# Patient Record
Sex: Male | Born: 1960 | Race: White | Hispanic: No | Marital: Married | Smoking: Former smoker
Health system: Southern US, Community
[De-identification: ages and names within clinical notes are randomized; demographics above are authoritative.]

## PROBLEM LIST (undated history)

## (undated) DIAGNOSIS — G4733 Obstructive sleep apnea (adult) (pediatric): Secondary | ICD-10-CM

## (undated) DIAGNOSIS — I10 Essential (primary) hypertension: Secondary | ICD-10-CM

## (undated) DIAGNOSIS — G4731 Primary central sleep apnea: Secondary | ICD-10-CM

## (undated) DIAGNOSIS — E782 Mixed hyperlipidemia: Secondary | ICD-10-CM

## (undated) DIAGNOSIS — M722 Plantar fascial fibromatosis: Secondary | ICD-10-CM

## (undated) DIAGNOSIS — K429 Umbilical hernia without obstruction or gangrene: Secondary | ICD-10-CM

## (undated) DIAGNOSIS — D126 Benign neoplasm of colon, unspecified: Secondary | ICD-10-CM

## (undated) DIAGNOSIS — M1712 Unilateral primary osteoarthritis, left knee: Secondary | ICD-10-CM

## (undated) DIAGNOSIS — G629 Polyneuropathy, unspecified: Secondary | ICD-10-CM

## (undated) DIAGNOSIS — R7303 Prediabetes: Secondary | ICD-10-CM

## (undated) DIAGNOSIS — H903 Sensorineural hearing loss, bilateral: Secondary | ICD-10-CM

## (undated) DIAGNOSIS — F1090 Alcohol use, unspecified, uncomplicated: Secondary | ICD-10-CM

## (undated) DIAGNOSIS — M1990 Unspecified osteoarthritis, unspecified site: Secondary | ICD-10-CM

## (undated) DIAGNOSIS — M069 Rheumatoid arthritis, unspecified: Secondary | ICD-10-CM

## (undated) HISTORY — PX: EYE SURGERY: SHX253

## (undated) HISTORY — DX: Primary central sleep apnea: G47.31

## (undated) HISTORY — PX: JOINT REPLACEMENT: SHX530

## (undated) HISTORY — DX: Unilateral primary osteoarthritis, left knee: M17.12

## (undated) HISTORY — PX: TOTAL KNEE ARTHROPLASTY: SHX125

## (undated) HISTORY — PX: VASECTOMY: SHX75

## (undated) HISTORY — PX: KNEE ARTHROSCOPY: SUR90

## (undated) HISTORY — PX: TONSILLECTOMY: SUR1361

## (undated) HISTORY — PX: CORNEA LACERATION REPAIR: SHX355

## (undated) HISTORY — PX: COLONOSCOPY: SHX174

## (undated) HISTORY — PX: CYSTECTOMY: SUR359

## (undated) HISTORY — PX: HAND SURGERY: SHX662

## (undated) HISTORY — PX: KNEE SURGERY: SHX244

---
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
## 1980-04-22 HISTORY — PX: FOREARM SURGERY: SHX651

## 2009-01-29 ENCOUNTER — Emergency Department: Payer: Self-pay | Admitting: Emergency Medicine

## 2009-03-23 ENCOUNTER — Ambulatory Visit: Payer: Self-pay | Admitting: Rheumatology

## 2009-11-23 ENCOUNTER — Ambulatory Visit: Payer: Self-pay | Admitting: Unknown Physician Specialty

## 2011-08-10 IMAGING — CR ORBITS FOR FOREIGN BODY - 2 VIEW
1 series · 3 of 3 positions shown · non-contrast
Comparison: none

REASON FOR EXAM: hx of metal in eye
COMMENTS:

PROCEDURE:     MDR - MDR ORBITS FOR MRI CLEARANCE  - November 23, 2009 [DATE]
RESULT:     Three views of the orbits reveal no evidence of retained
metallic foreign bodies. The bony structures are grossly normal.

[Series 1: view not recorded · 0.17mm/px · 3 of 3 slices shown]
[im 1/3]
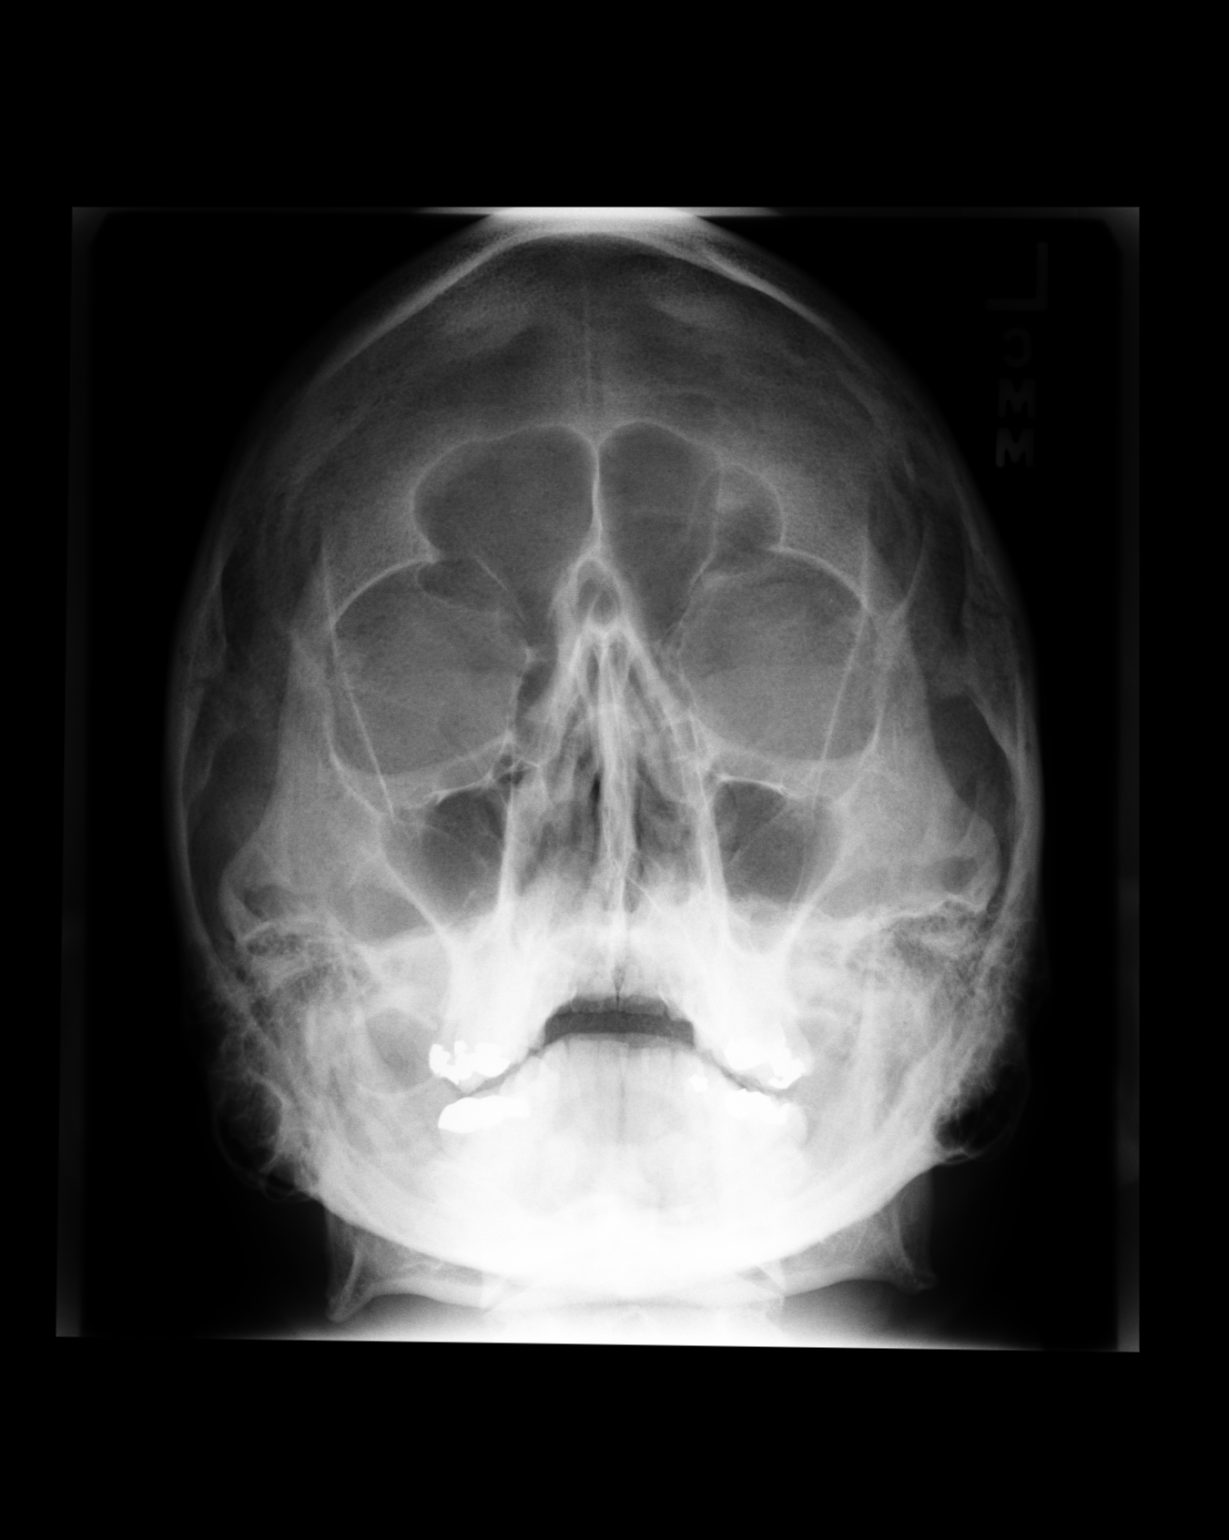
[im 2/3]
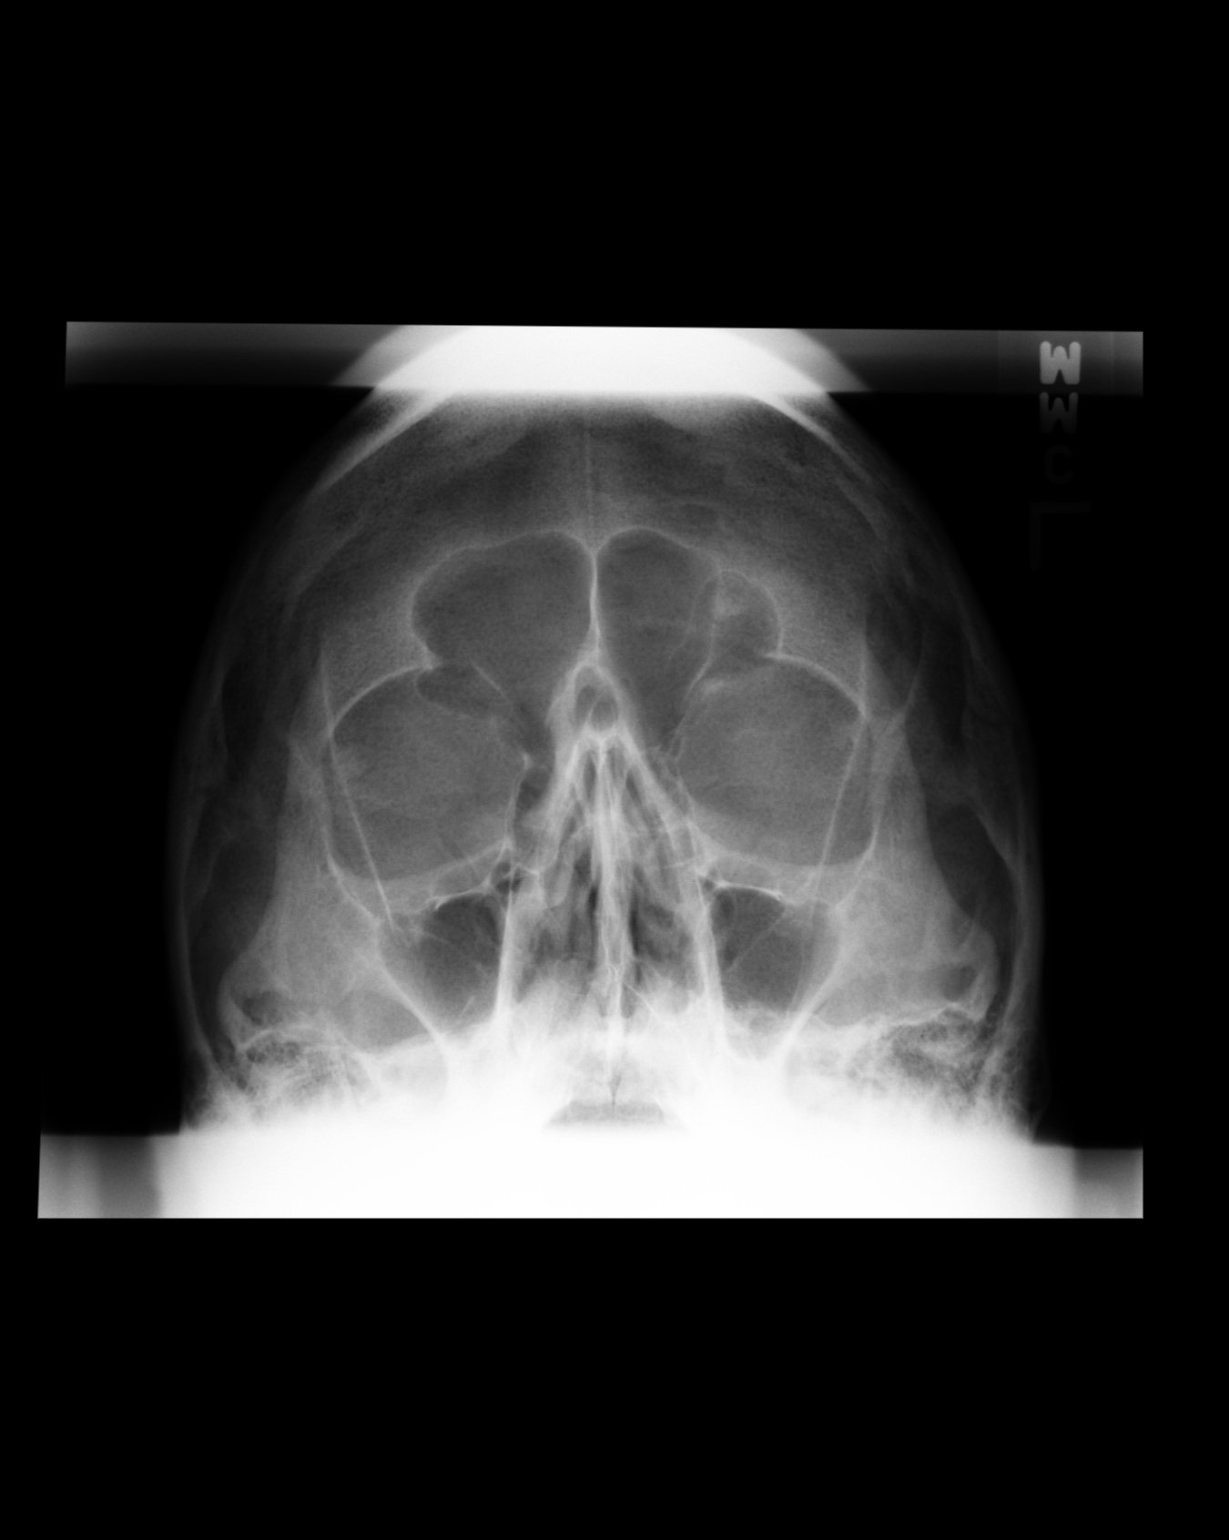
[im 3/3]
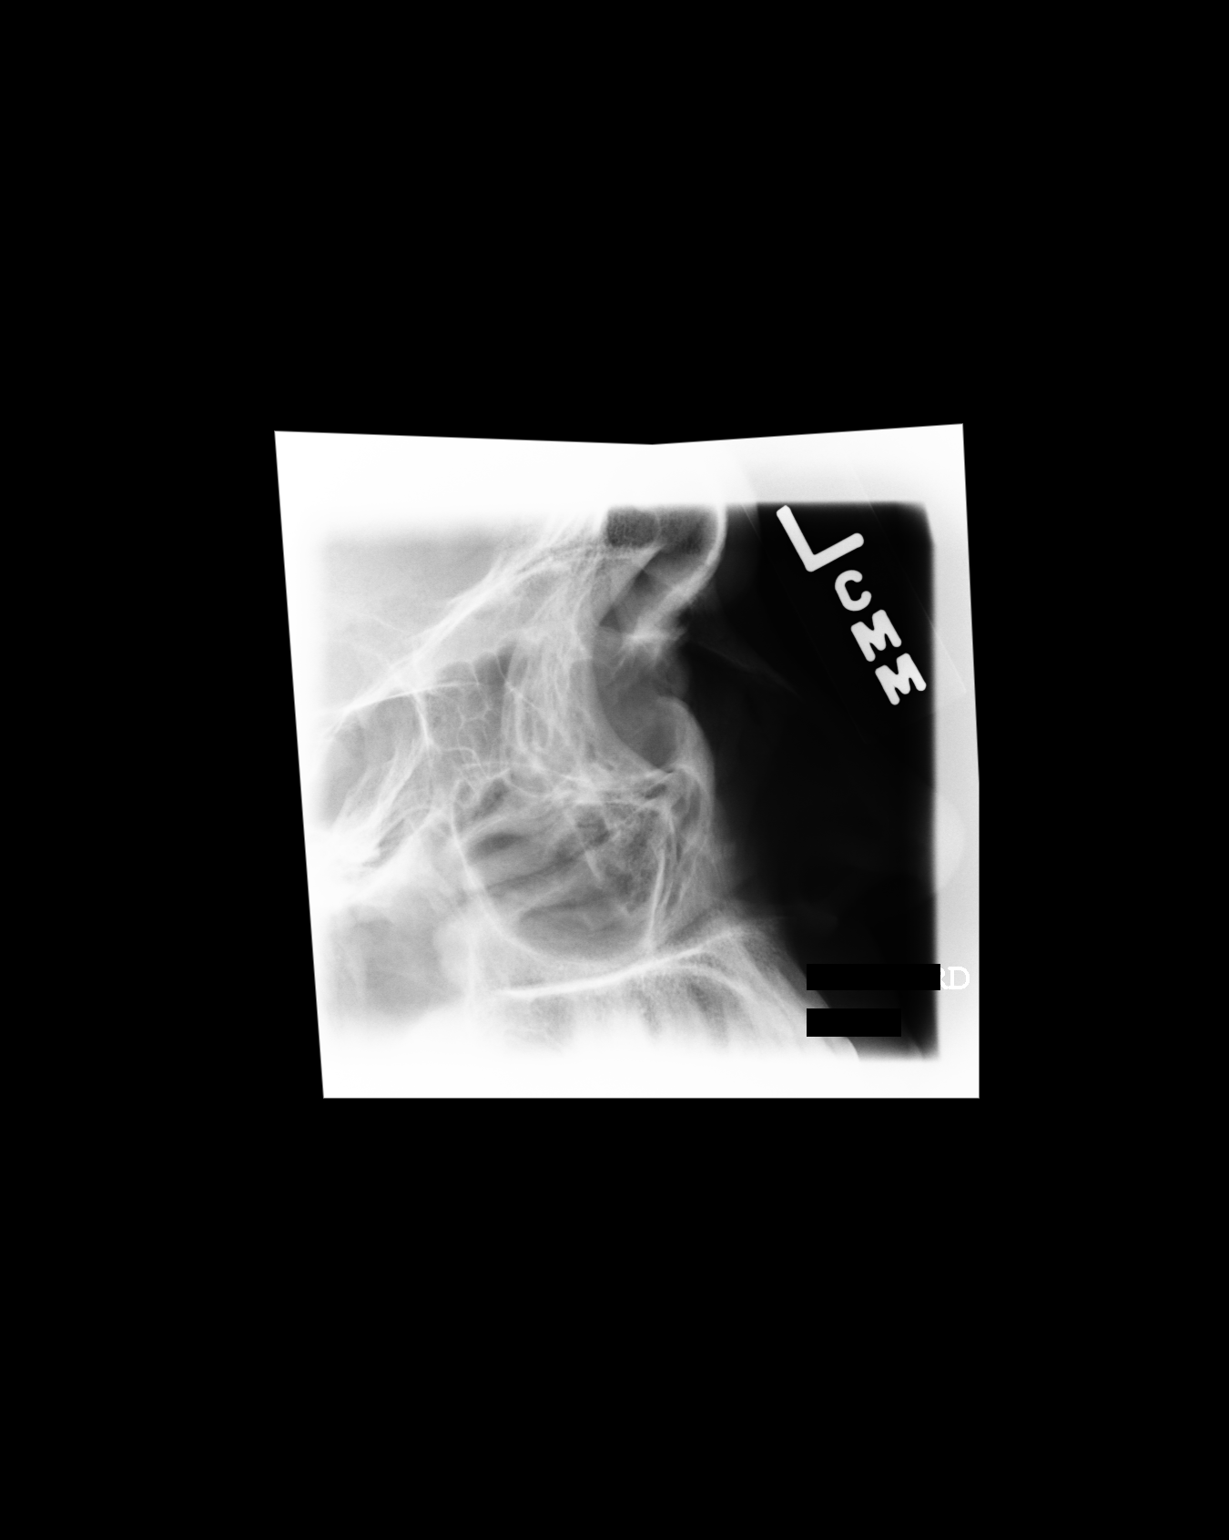

[3 of 3 positions shown; findings below may reference images not displayed]

IMPRESSION: I do not see evidence of retained metallic foreign bodies
over the orbits. I see no contraindication to MRI.

## 2013-01-27 DIAGNOSIS — G629 Polyneuropathy, unspecified: Secondary | ICD-10-CM | POA: Insufficient documentation

## 2014-09-30 ENCOUNTER — Emergency Department
Admission: EM | Admit: 2014-09-30 | Discharge: 2014-09-30 | Disposition: A | Discharge status: Home / Self Care | Payer: Worker's Compensation | Attending: Emergency Medicine | Admitting: Emergency Medicine

## 2014-09-30 ENCOUNTER — Emergency Department: Payer: Worker's Compensation

## 2014-09-30 ENCOUNTER — Encounter: Payer: Self-pay | Admitting: Emergency Medicine

## 2014-09-30 DIAGNOSIS — Y9389 Activity, other specified: Secondary | ICD-10-CM | POA: Diagnosis not present

## 2014-09-30 DIAGNOSIS — S61219A Laceration without foreign body of unspecified finger without damage to nail, initial encounter: Secondary | ICD-10-CM

## 2014-09-30 DIAGNOSIS — Y998 Other external cause status: Secondary | ICD-10-CM | POA: Diagnosis not present

## 2014-09-30 DIAGNOSIS — Z792 Long term (current) use of antibiotics: Secondary | ICD-10-CM | POA: Insufficient documentation

## 2014-09-30 DIAGNOSIS — S61314A Laceration without foreign body of right ring finger with damage to nail, initial encounter: Secondary | ICD-10-CM | POA: Diagnosis not present

## 2014-09-30 DIAGNOSIS — Y9289 Other specified places as the place of occurrence of the external cause: Secondary | ICD-10-CM | POA: Insufficient documentation

## 2014-09-30 DIAGNOSIS — W231XXA Caught, crushed, jammed, or pinched between stationary objects, initial encounter: Secondary | ICD-10-CM | POA: Insufficient documentation

## 2014-09-30 DIAGNOSIS — S61316A Laceration without foreign body of right little finger with damage to nail, initial encounter: Secondary | ICD-10-CM | POA: Diagnosis not present

## 2014-09-30 DIAGNOSIS — S6791XA Crushing injury of unspecified part(s) of right wrist, hand and fingers, initial encounter: Secondary | ICD-10-CM | POA: Diagnosis present

## 2014-09-30 DIAGNOSIS — S6720XA Crushing injury of unspecified hand, initial encounter: Secondary | ICD-10-CM

## 2014-09-30 HISTORY — DX: Rheumatoid arthritis, unspecified: M06.9

## 2014-09-30 MED ORDER — CEPHALEXIN 500 MG PO CAPS: 500.0000 mg | ORAL_CAPSULE | Freq: Four times a day (QID) | ORAL | Status: AC

## 2014-09-30 MED ORDER — LIDOCAINE HCL (PF) 1 % IJ SOLN
INTRAMUSCULAR | Status: AC
  Filled 2014-09-30: qty 10

## 2014-09-30 MED ORDER — MORPHINE SULFATE 4 MG/ML IJ SOLN
INTRAMUSCULAR | Status: AC
  Administered 2014-09-30: 4 mg
  Filled 2014-09-30: qty 1

## 2014-09-30 MED ORDER — ONDANSETRON HCL 4 MG/2ML IJ SOLN
INTRAMUSCULAR | Status: AC
  Administered 2014-09-30: 4 mg
  Filled 2014-09-30: qty 2

## 2014-09-30 MED ORDER — BACITRACIN-NEOMYCIN-POLYMYXIN 400-5-5000 EX OINT
TOPICAL_OINTMENT | CUTANEOUS | Status: AC
  Filled 2014-09-30: qty 2

## 2014-09-30 MED ORDER — OXYCODONE-ACETAMINOPHEN 5-325 MG PO TABS: 1.0000 | ORAL_TABLET | Freq: Four times a day (QID) | ORAL | Status: DC | PRN

## 2014-09-30 MED ORDER — ONDANSETRON 4 MG PO TBDP: 4.0000 mg | ORAL_TABLET | Freq: Three times a day (TID) | ORAL | Status: DC | PRN

## 2014-09-30 MED ORDER — IBUPROFEN 800 MG PO TABS: 800.0000 mg | ORAL_TABLET | Freq: Three times a day (TID) | ORAL | Status: DC | PRN

## 2014-09-30 MED ORDER — CEFAZOLIN SODIUM 1-5 GM-% IV SOLN
1.0000 g | Freq: Once | INTRAVENOUS | Status: AC
  Administered 2014-09-30: 1 g via INTRAVENOUS
  Filled 2014-09-30: qty 50

## 2014-09-30 MED ORDER — HYDROMORPHONE HCL 1 MG/ML IJ SOLN
INTRAMUSCULAR | Status: AC
  Filled 2014-09-30: qty 1

## 2014-09-30 MED ORDER — HYDROMORPHONE HCL 1 MG/ML IJ SOLN
1.0000 mg | Freq: Once | INTRAMUSCULAR | Status: AC
Start: 2014-09-30 — End: 2014-09-30
  Administered 2014-09-30: 1 mg via INTRAVENOUS

## 2014-09-30 NOTE — ED Notes (Signed)
Works at [employer], changes a sprocket, states his fingers got smashed and cut.  Right pinky and ring finger.  Bleeding controlled.  Fingers were trapped in machine for about a minute until help arrived.

## 2014-09-30 NOTE — Discharge Instructions (Signed)
Crush Injury, Fingers or Toes °A crush injury to the fingers or toes means the tissues have been damaged by being squeezed (compressed). There will be bleeding into the tissues and swelling. Often, blood will collect under the skin. When this happens, the skin on the finger often dies and may slough off (shed) 1 week to 10 days later. Usually, new skin is growing underneath. If the injury has been too severe and the tissue does not survive, the damaged tissue may begin to turn black over several days.  °Wounds which occur because of the crushing may be stitched (sutured) shut. However, crush injuries are more likely to become infected than other injuries. These wounds may not be closed as tightly as other types of cuts to prevent infection. Nails involved are often lost. These usually grow back over several weeks.  °DIAGNOSIS °X-rays may be taken to see if there is any injury to the bones. °TREATMENT °Broken bones (fractures) may be treated with splinting, depending on the fracture. Often, no treatment is required for fractures of the last bone in the fingers or toes. °HOME CARE INSTRUCTIONS  °· The crushed part should be raised (elevated) above the heart or center of the chest as much as possible for the first several days or as directed. This helps with pain and lessens swelling. Less swelling increases the chances that the crushed part will survive. °· Put ice on the injured area. °¨ Put ice in a plastic bag. °¨ Place a towel between your skin and the bag. °¨ Leave the ice on for 15-20 minutes, 03-04 times a day for the first 2 days. °· Only take over-the-counter or prescription medicines for pain, discomfort, or fever as directed by your caregiver. °· Use your injured part only as directed. °· Change your bandages (dressings) as directed. °· Keep all follow-up appointments as directed by your caregiver. Not keeping your appointment could result in a chronic or permanent injury, pain, and disability. If there is  any problem keeping the appointment, you must call to reschedule. °SEEK IMMEDIATE MEDICAL CARE IF:  °· There is redness, swelling, or increasing pain in the wound area. °· Pus is coming from the wound. °· You have a fever. °· You notice a bad smell coming from the wound or dressing. °· The edges of the wound do not stay together after the sutures have been removed. °· You are unable to move the injured finger or toe. °MAKE SURE YOU:  °· Understand these instructions. °· Will watch your condition. °· Will get help right away if you are not doing well or get worse. °Document Released: 04/08/2005 Document Revised: 07/01/2011 Document Reviewed: 08/24/2010 °[employer]® Patient Information ©2015 [employer]. This information is not intended to replace advice given to you by your health care provider. Make sure you discuss any questions you have with your health care provider. ° °

## 2014-09-30 NOTE — ED Provider Notes (Signed)
CSN: [id number]     Arrival date & time 09/30/14  1543 History   First MD Initiated Contact with Patient 09/30/14 1621     Chief Complaint  Patient presents with  . Finger Injury     (Consider location/radiation/quality/duration/timing/severity/associated sxs/prior Treatment) HPI Patient arrives today after having a crush injury to his right fourth and fifth digits states that he got them caught in a chain were stuck for approximately 1 minute and felt like a practically ripped the tips of his finger off states one of his fingers he can't move the tip of it the other one is almost completely torn rates his pain as a 10 out of 10 burning throbbing pain nothing making it particularly better or worse denies any other complaints at this time is here today for wound evaluation and closure Past Medical History  Diagnosis Date  . Rheumatoid arthritis    Past Surgical History  Procedure Laterality Date  . Hand surgery Left   . Knee surgery Right    No family history on file. History  Substance Use Topics  . Smoking status: Never Smoker   . Smokeless tobacco: Not on file  . Alcohol Use: 7.2 oz/week    12 Cans of beer per week    Review of Systems  Constitutional: Negative.   HENT: Negative.   Eyes: Negative.   Respiratory: Negative.   Cardiovascular: Negative.   Gastrointestinal: Negative.   Musculoskeletal: Negative.   Skin: Negative.   Neurological: Negative.   All other systems reviewed and are negative.      Allergies  Methotrexate derivatives  Home Medications   Prior to Admission medications   Medication Sig Start Date End Date Taking? Authorizing Provider  cephALEXin (KEFLEX) 500 MG capsule Take 1 capsule (500 mg total) by mouth 4 (four) times daily. 09/30/14 10/10/14  Camp Gopal William C Poonam Woehrle, PA-C  ibuprofen (ADVIL,MOTRIN) 800 MG tablet Take 1 tablet (800 mg total) by mouth every 8 (eight) hours as needed. 09/30/14   Jevaughn Degollado William C Burl Tauzin, PA-C  ondansetron (ZOFRAN  ODT) 4 MG disintegrating tablet Take 1 tablet (4 mg total) by mouth every 8 (eight) hours as needed for nausea or vomiting. 09/30/14   Shakil Dirk Verdene Rio, PA-C  oxyCODONE-acetaminophen (ROXICET) 5-325 MG per tablet Take 1-2 tablets by mouth every 6 (six) hours as needed for moderate pain or severe pain. 09/30/14 09/30/15  Catalina Salasar William C Ciro Tashiro, PA-C   BP 161/100 mmHg  Pulse 76  Temp(Src) 97.7 F (36.5 C) (Oral)  Resp 20  Ht 6\' 1"  (1.854 m)  Wt 220 lb (99.791 kg)  BMI 29.03 kg/m2  SpO2 97% Physical Exam Male appearing stated age well-developed well-nourished mild distress Vitals were reviewed Head ears eyes nose neck and throat examination this patient was grossly unremarkable Cardiovascular regular rate and rhythm no murmurs rubs gallops Pulmonary lungs clear to auscultation bilaterally Musculoskeletal his right hand he has a near complete amputation of his distal tip of his fifth digit exposed bone nails avulsed appears to have an extensor tendon injury to his fourth digit with a laceration both on the palmar and dorsal side of the finger limited range of motion of the hand due to pain at the fingerstick doesn't appear to be grossly functional deficit other than the 2 injured fingers total laceration length between the fingers approximately 6 cm Neuro exams nonfocal cranial nerves II through XII grossly intact Skin safe or lacerations documented in the musculoskeletal exam is otherwise free of rash or disease  Psychologically patient is very pleasant and acting appropriately ED Course  Procedures  Laceration repair Digital blocks were performed both the fourth and fifth digits on the right hand Wounds were irrigated cleaned and scrubbed with saline and Betadine And was prepped and in sterile draping 1 3-0 nylon suture was placed to the dorsal side of the fourth digit and one to the palmar side of the fourth digit laceration 1 3-0 nylon suture was placed through the nail of the fifth  digit splinting it back into place Approximately 2-1/2 ML's lidocaine were used in each finger Good hemostasis patient tolerated the procedure well Wounds were dressed and splinted Labs Review Labs Reviewed - No data to display  Imaging Review Dg Hand Complete Right  09/30/2014   CLINICAL DATA:  Near-total amputation clinically of the distal aspect of the fifth finger  EXAM: RIGHT HAND - COMPLETE 3+ VIEW  COMPARISON:  None.  FINDINGS: The patient has sustained partial amputation of the tuft of the distal phalanx of the fifth finger. The fracture fragment is distracted from the remainder of the fifth distal phalanx by approximately 5 mm. There is disruption of the overlying soft tissues. The adjacent digits are grossly normal. There does appear to be a chronic flexion deformity of the DIP joint of the fourth finger.  IMPRESSION: There is partial amputation of the distal aspect of the distal phalanx of the fifth finger.   Electronically Signed   By: David  Martinique M.D.   On: 09/30/2014 16:47     EKG Interpretation None     while the department the patient received 1 g of Ancef IV 4 mg of morphine 4 mg of Zofran and 1 mg of Dilaudid patient's tetanus was up-to-date  MDM  Decision making on this patient after initially reviewing the patient's x-rays and wound consulted orthopedics on call Dr. Rudene Christians who reviewed pictures the patient's wounds as well as x-rays and recommended packing the wounds together with just one suture and splinting them and have him follow up in the office will being discharged on medication for pain management and antibiotics discussed this with the patient they're comfortable with this plan they will follow-up with Dr. Rudene Christians Monday morning they're return here for any acute concerns or worsening symptoms Final diagnoses:  Crushing injury of hand and fingers, initial encounter  Finger laceration, initial encounter       Jennings Corado Verdene Rio, PA-C 09/30/14 1834  Hinda Kehr, MD 09/30/14 2332

## 2014-09-30 NOTE — ED Notes (Signed)
At work injured right hand, has lac to right 4th finger tip, crush injury to right 5 th finger, bleeding controlled

## 2014-11-25 DIAGNOSIS — I10 Essential (primary) hypertension: Secondary | ICD-10-CM | POA: Insufficient documentation

## 2015-01-18 ENCOUNTER — Other Ambulatory Visit: Payer: Self-pay | Admitting: Orthopedic Surgery

## 2015-01-19 ENCOUNTER — Encounter (HOSPITAL_BASED_OUTPATIENT_CLINIC_OR_DEPARTMENT_OTHER): Payer: Self-pay | Admitting: *Deleted

## 2015-01-20 ENCOUNTER — Encounter (HOSPITAL_BASED_OUTPATIENT_CLINIC_OR_DEPARTMENT_OTHER)
Admission: RE | Admit: 2015-01-20 | Discharge: 2015-01-20 | Disposition: A | Discharge status: Home / Self Care | Payer: PRIVATE HEALTH INSURANCE | Source: Ambulatory Visit | Attending: Orthopedic Surgery | Admitting: Orthopedic Surgery

## 2015-01-20 DIAGNOSIS — M069 Rheumatoid arthritis, unspecified: Secondary | ICD-10-CM | POA: Diagnosis not present

## 2015-01-20 DIAGNOSIS — Y990 Civilian activity done for income or pay: Secondary | ICD-10-CM | POA: Diagnosis not present

## 2015-01-20 DIAGNOSIS — W458XXA Other foreign body or object entering through skin, initial encounter: Secondary | ICD-10-CM | POA: Diagnosis not present

## 2015-01-20 DIAGNOSIS — M20011 Mallet finger of right finger(s): Secondary | ICD-10-CM | POA: Diagnosis not present

## 2015-01-20 DIAGNOSIS — S66324A Laceration of extensor muscle, fascia and tendon of right ring finger at wrist and hand level, initial encounter: Secondary | ICD-10-CM | POA: Diagnosis not present

## 2015-01-20 DIAGNOSIS — I10 Essential (primary) hypertension: Secondary | ICD-10-CM | POA: Diagnosis not present

## 2015-01-20 DIAGNOSIS — Z87891 Personal history of nicotine dependence: Secondary | ICD-10-CM | POA: Diagnosis not present

## 2015-01-20 LAB — BASIC METABOLIC PANEL
ANION GAP: 6 (ref 5–15)
BUN: 15 mg/dL (ref 6–20)
CHLORIDE: 101 mmol/L (ref 101–111)
CO2: 28 mmol/L (ref 22–32)
Calcium: 9.1 mg/dL (ref 8.9–10.3)
Creatinine, Ser: 0.93 mg/dL (ref 0.61–1.24)
Glucose, Bld: 104 mg/dL — ABNORMAL HIGH (ref 65–99)
POTASSIUM: 5 mmol/L (ref 3.5–5.1)
SODIUM: 135 mmol/L (ref 135–145)

## 2015-01-23 NOTE — H&P (Signed)
Francis Robinson is an 54 y.o. male.   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  He reports a work-related injury that occurred on the date above, resulting in lacerations primarily to the long and small fingers.  He was initially provided care in the emergency department, where the wounds were closed.  His small finger was severed such that there remained a volar skin bridge.  The ring finger apparently sustained a laceration at the level of the distal aspect of the middle phalanx, dividing the skin and likely the extensor tendon, creating an open mallet injury.  Subsequent to treatment in the emergency department, he was provided care by Dr. Hessie Robinson at [facility].  He reports that he went to a splinting process for the ring finger, but also had an open wound with which to deal and indicates that the splinting was not continuous, every moment of every day in the early period of healing.  More recently, on 11-16-14, due to a persisting large extensor lag, it was recommended the patient consider DIP fusion as a remedy.  The patient indicates that the persisting extensor lag at the ring finger is the most debilitating, with the finger getting caught at times on his environment due to the hooked distal end.  Past Medical History  Diagnosis Date  . Rheumatoid arthritis   . Hypertension     Past Surgical History  Procedure Laterality Date  . Hand surgery Left   . Knee surgery Right   . Tonsillectomy      History reviewed. No pertinent family history. Social History:  reports that he quit smoking about 10 years ago. He has never used smokeless tobacco. He reports that he drinks about 7.2 oz of alcohol per week. He reports that he does not use illicit drugs.  Allergies:  Allergies  Allergen Reactions  . Methotrexate Derivatives Anaphylaxis     No prescriptions prior to admission    No results found for this or any previous visit (from the past 48 hour(s)). No results found.  Review of Systems  All other systems reviewed and are negative.   Height 6\' 1"  (1.854 m), weight 99.791 kg (220 lb). Physical Exam  Constitutional:  WD, WN, NAD HEENT:  NCAT, EOMI Neuro/Psych:  Alert & oriented to person, place, and time; appropriate mood & affect Lymphatic: No generalized UE edema or lymphadenopathy Extremities / MSK:  Both UE are normal with respect to appearance, ranges of motion, joint stability, muscle strength/tone, sensation, & perfusion except as otherwise noted:  The patient's right ring and small fingers have healed dorsal wounds.  The small finger is at the tip, about the midportion of the nail.  The nail plate and nailbed appear well healed.  There is no significant tenderness with palpation of the small finger.  The ring finger laceration is slightly curvilinear at the level of the DIP joint and slightly proximal to it.  There is a 45 extensor lag at the DIP joint which is passively correctable.  Flexor tendon is intact.  The patient has an intact palmaris longus on this side.  Labs / Xrays:  No radiographic studies obtained today.  Most recent x-rays obtained elsewhere are reviewed, revealing no significant fractures of the ring finger.  In addition, the DIP joint has no significant arthritic change.  The small finger distal phalangeal  fracture has achieved partial bony union at this point   Assessment: 1.  Well healed right small finger incomplete amputation without significant ongoing sequelae 2.  Right ring finger persisting extensor lag  Recommendations:  I discussed these findings with him and his nurse case manager.  I indicated that I think there are 4 distinct options for proceeding at this point.  The first is to accept the digit as it is, optimizing his function in the context of a chronic persisting mallet  deformity.  The second would be to reinstitute a closed splinting protocol at this time, with protracted full-time extension splinting of the DIP joint to see whether it would be successful in alleviating the extensor lag.  The third and fourth options are both surgical.  The third option would be open repair of the extensor apparatus, possibly with grafting, with likely temporary DIP joint pinning.  The fourth option is arthrodesis of the joint.  I indicated that the likelihood of success of option #2 is unclear.  If one were to employ option #3, there may still be some degree of recurrent extensor lag anyway.  In addition, even if one were to opt for option #3, intraoperatively there may be a need to switch from option #3 to option #4 based upon the status of the extensor mechanism once opened and explored.    In addition, we discussed in some degree of length in detail rationale for different positions for fusion of the DIP joint, methods used to obtain fusion, et Francis Robinson.  Although fusion would be highly predictable in alleviating the extensor lag, it does so at the cost of sacrificing an otherwise unblemished joint for the sake of his extensor tendon deficiency when it remains unclear whether such deficiency itself could be repaired or reconstructed.  Francis Calia A. 01/23/2015, 2:46 PM

## 2015-01-24 ENCOUNTER — Ambulatory Visit (HOSPITAL_BASED_OUTPATIENT_CLINIC_OR_DEPARTMENT_OTHER)
Admission: RE | Admit: 2015-01-24 | Discharge: 2015-01-24 | Disposition: A | Discharge status: Home / Self Care | Payer: Worker's Compensation | Source: Ambulatory Visit | Attending: Orthopedic Surgery | Admitting: Orthopedic Surgery

## 2015-01-24 ENCOUNTER — Encounter (HOSPITAL_BASED_OUTPATIENT_CLINIC_OR_DEPARTMENT_OTHER)
Admission: RE | Disposition: A | Discharge status: Home / Self Care | Payer: Self-pay | Source: Ambulatory Visit | Attending: Orthopedic Surgery

## 2015-01-24 ENCOUNTER — Encounter (HOSPITAL_BASED_OUTPATIENT_CLINIC_OR_DEPARTMENT_OTHER): Payer: Self-pay

## 2015-01-24 ENCOUNTER — Ambulatory Visit (HOSPITAL_BASED_OUTPATIENT_CLINIC_OR_DEPARTMENT_OTHER): Payer: Worker's Compensation | Admitting: Anesthesiology

## 2015-01-24 ENCOUNTER — Ambulatory Visit (HOSPITAL_COMMUNITY): Payer: Worker's Compensation

## 2015-01-24 DIAGNOSIS — S66324A Laceration of extensor muscle, fascia and tendon of right ring finger at wrist and hand level, initial encounter: Secondary | ICD-10-CM | POA: Insufficient documentation

## 2015-01-24 DIAGNOSIS — M20011 Mallet finger of right finger(s): Secondary | ICD-10-CM | POA: Diagnosis not present

## 2015-01-24 DIAGNOSIS — W458XXA Other foreign body or object entering through skin, initial encounter: Secondary | ICD-10-CM | POA: Insufficient documentation

## 2015-01-24 DIAGNOSIS — M9689 Other intraoperative and postprocedural complications and disorders of the musculoskeletal system: Secondary | ICD-10-CM

## 2015-01-24 DIAGNOSIS — I10 Essential (primary) hypertension: Secondary | ICD-10-CM | POA: Diagnosis not present

## 2015-01-24 DIAGNOSIS — Y990 Civilian activity done for income or pay: Secondary | ICD-10-CM | POA: Insufficient documentation

## 2015-01-24 DIAGNOSIS — M069 Rheumatoid arthritis, unspecified: Secondary | ICD-10-CM | POA: Diagnosis not present

## 2015-01-24 DIAGNOSIS — Z87891 Personal history of nicotine dependence: Secondary | ICD-10-CM | POA: Insufficient documentation

## 2015-01-24 HISTORY — DX: Essential (primary) hypertension: I10

## 2015-01-24 HISTORY — PX: REPAIR EXTENSOR TENDON: SHX5382

## 2015-01-24 SURGERY — REPAIR, TENDON, EXTENSOR
Anesthesia: General | Site: Finger | Laterality: Right

## 2015-01-24 MED ORDER — FENTANYL CITRATE (PF) 100 MCG/2ML IJ SOLN
INTRAMUSCULAR | Status: AC
  Filled 2015-01-24: qty 4

## 2015-01-24 MED ORDER — MIDAZOLAM HCL 2 MG/2ML IJ SOLN
1.0000 mg | INTRAMUSCULAR | Status: DC | PRN
  Administered 2015-01-24: 2 mg via INTRAVENOUS

## 2015-01-24 MED ORDER — ONDANSETRON HCL 4 MG/2ML IJ SOLN
INTRAMUSCULAR | Status: DC | PRN
  Administered 2015-01-24: 4 mg via INTRAVENOUS

## 2015-01-24 MED ORDER — PROPOFOL 10 MG/ML IV BOLUS
INTRAVENOUS | Status: AC
  Filled 2015-01-24: qty 20

## 2015-01-24 MED ORDER — DEXAMETHASONE SODIUM PHOSPHATE 10 MG/ML IJ SOLN
INTRAMUSCULAR | Status: DC | PRN
  Administered 2015-01-24: 10 mg via INTRAVENOUS

## 2015-01-24 MED ORDER — MIDAZOLAM HCL 2 MG/2ML IJ SOLN
INTRAMUSCULAR | Status: AC
  Filled 2015-01-24: qty 4

## 2015-01-24 MED ORDER — MEPERIDINE HCL 25 MG/ML IJ SOLN: 6.2500 mg | INTRAMUSCULAR | Status: DC | PRN

## 2015-01-24 MED ORDER — LACTATED RINGERS IV SOLN
INTRAVENOUS | Status: DC
  Administered 2015-01-24 (×2): via INTRAVENOUS

## 2015-01-24 MED ORDER — ONDANSETRON HCL 4 MG/2ML IJ SOLN
INTRAMUSCULAR | Status: AC
  Filled 2015-01-24: qty 2

## 2015-01-24 MED ORDER — SCOPOLAMINE 1 MG/3DAYS TD PT72: 1.0000 | MEDICATED_PATCH | Freq: Once | TRANSDERMAL | Status: DC | PRN

## 2015-01-24 MED ORDER — LIDOCAINE HCL (CARDIAC) 20 MG/ML IV SOLN
INTRAVENOUS | Status: DC | PRN
  Administered 2015-01-24: 50 mg via INTRAVENOUS

## 2015-01-24 MED ORDER — DEXAMETHASONE SODIUM PHOSPHATE 10 MG/ML IJ SOLN
INTRAMUSCULAR | Status: AC
  Filled 2015-01-24: qty 1

## 2015-01-24 MED ORDER — KETOROLAC TROMETHAMINE 30 MG/ML IJ SOLN
INTRAMUSCULAR | Status: AC
  Filled 2015-01-24: qty 1

## 2015-01-24 MED ORDER — BUPIVACAINE-EPINEPHRINE (PF) 0.5% -1:200000 IJ SOLN
INTRAMUSCULAR | Status: AC
  Filled 2015-01-24: qty 90

## 2015-01-24 MED ORDER — OXYCODONE HCL 5 MG PO TABS: 5.0000 mg | ORAL_TABLET | Freq: Once | ORAL | Status: DC | PRN

## 2015-01-24 MED ORDER — BUPIVACAINE-EPINEPHRINE 0.5% -1:200000 IJ SOLN
INTRAMUSCULAR | Status: DC | PRN
  Administered 2015-01-24: 10 mL

## 2015-01-24 MED ORDER — LIDOCAINE HCL (CARDIAC) 20 MG/ML IV SOLN
INTRAVENOUS | Status: AC
  Filled 2015-01-24: qty 15

## 2015-01-24 MED ORDER — OXYCODONE-ACETAMINOPHEN 5-325 MG PO TABS: 1.0000 | ORAL_TABLET | Freq: Four times a day (QID) | ORAL | Status: DC | PRN

## 2015-01-24 MED ORDER — PROPOFOL 10 MG/ML IV BOLUS
INTRAVENOUS | Status: DC | PRN
  Administered 2015-01-24: 250 mg via INTRAVENOUS

## 2015-01-24 MED ORDER — FENTANYL CITRATE (PF) 100 MCG/2ML IJ SOLN
50.0000 ug | INTRAMUSCULAR | Status: DC | PRN
  Administered 2015-01-24: 100 ug via INTRAVENOUS

## 2015-01-24 MED ORDER — KETOROLAC TROMETHAMINE 30 MG/ML IJ SOLN
INTRAMUSCULAR | Status: DC | PRN
  Administered 2015-01-24: 30 mg via INTRAVENOUS

## 2015-01-24 MED ORDER — CEFAZOLIN SODIUM-DEXTROSE 2-3 GM-% IV SOLR
2.0000 g | INTRAVENOUS | Status: AC
  Administered 2015-01-24: 2 g via INTRAVENOUS

## 2015-01-24 MED ORDER — GLYCOPYRROLATE 0.2 MG/ML IJ SOLN: 0.2000 mg | Freq: Once | INTRAMUSCULAR | Status: DC | PRN

## 2015-01-24 MED ORDER — OXYCODONE HCL 5 MG/5ML PO SOLN: 5.0000 mg | Freq: Once | ORAL | Status: DC | PRN

## 2015-01-24 MED ORDER — LACTATED RINGERS IV SOLN: INTRAVENOUS | Status: DC

## 2015-01-24 MED ORDER — CEFAZOLIN SODIUM-DEXTROSE 2-3 GM-% IV SOLR
INTRAVENOUS | Status: AC
  Filled 2015-01-24: qty 50

## 2015-01-24 MED ORDER — HYDROMORPHONE HCL 1 MG/ML IJ SOLN: 0.2500 mg | INTRAMUSCULAR | Status: DC | PRN

## 2015-01-24 SURGICAL SUPPLY — 69 items
BLADE MINI RND TIP GREEN BEAV (BLADE) IMPLANT
BLADE SURG 15 STRL LF DISP TIS (BLADE) ×1 IMPLANT
BLADE SURG 15 STRL SS (BLADE) ×2
BNDG COHESIVE 1X5 TAN STRL LF (GAUZE/BANDAGES/DRESSINGS) ×3 IMPLANT
BNDG COHESIVE 4X5 TAN STRL (GAUZE/BANDAGES/DRESSINGS) ×3 IMPLANT
BNDG CONFORM 2 STRL LF (GAUZE/BANDAGES/DRESSINGS) ×3 IMPLANT
BNDG ESMARK 4X9 LF (GAUZE/BANDAGES/DRESSINGS) ×3 IMPLANT
BNDG GAUZE ELAST 4 BULKY (GAUZE/BANDAGES/DRESSINGS) IMPLANT
CHLORAPREP W/TINT 26ML (MISCELLANEOUS) ×3 IMPLANT
CORDS BIPOLAR (ELECTRODE) ×3 IMPLANT
COVER BACK TABLE 60X90IN (DRAPES) ×3 IMPLANT
COVER MAYO STAND STRL (DRAPES) ×3 IMPLANT
CUFF TOURNIQUET SINGLE 18IN (TOURNIQUET CUFF) ×3 IMPLANT
DECANTER SPIKE VIAL GLASS SM (MISCELLANEOUS) IMPLANT
DEPRESSOR TONGUE BLADE STERILE (MISCELLANEOUS) ×3 IMPLANT
DRAPE EXTREMITY T 121X128X90 (DRAPE) ×3 IMPLANT
DRAPE SURG 17X23 STRL (DRAPES) ×3 IMPLANT
DRSG EMULSION OIL 3X3 NADH (GAUZE/BANDAGES/DRESSINGS) ×3 IMPLANT
GLOVE BIO SURGEON STRL SZ 6.5 (GLOVE) ×2 IMPLANT
GLOVE BIO SURGEON STRL SZ7 (GLOVE) ×3 IMPLANT
GLOVE BIO SURGEON STRL SZ7.5 (GLOVE) ×3 IMPLANT
GLOVE BIO SURGEONS STRL SZ 6.5 (GLOVE) ×1
GLOVE BIOGEL PI IND STRL 7.0 (GLOVE) ×2 IMPLANT
GLOVE BIOGEL PI IND STRL 7.5 (GLOVE) ×1 IMPLANT
GLOVE BIOGEL PI IND STRL 8 (GLOVE) ×1 IMPLANT
GLOVE BIOGEL PI INDICATOR 7.0 (GLOVE) ×4
GLOVE BIOGEL PI INDICATOR 7.5 (GLOVE) ×2
GLOVE BIOGEL PI INDICATOR 8 (GLOVE) ×2
GLOVE ECLIPSE 6.5 STRL STRAW (GLOVE) ×3 IMPLANT
GLOVE EXAM NITRILE EXT CUFF MD (GLOVE) ×3 IMPLANT
GOWN STRL REUS W/ TWL LRG LVL3 (GOWN DISPOSABLE) ×2 IMPLANT
GOWN STRL REUS W/TWL LRG LVL3 (GOWN DISPOSABLE) ×4
GOWN STRL REUS W/TWL XL LVL3 (GOWN DISPOSABLE) ×3 IMPLANT
K-WIRE .062X4 (WIRE) ×3 IMPLANT
LOOP VESSEL MAXI BLUE (MISCELLANEOUS) IMPLANT
LOOP VESSEL MINI RED (MISCELLANEOUS) IMPLANT
NEEDLE HYPO 25X1 1.5 SAFETY (NEEDLE) ×3 IMPLANT
NS IRRIG 1000ML POUR BTL (IV SOLUTION) ×3 IMPLANT
PACK BASIN DAY SURGERY FS (CUSTOM PROCEDURE TRAY) ×3 IMPLANT
PADDING CAST ABS 3INX4YD NS (CAST SUPPLIES)
PADDING CAST ABS 4INX4YD NS (CAST SUPPLIES) ×2
PADDING CAST ABS COTTON 3X4 (CAST SUPPLIES) IMPLANT
PADDING CAST ABS COTTON 4X4 ST (CAST SUPPLIES) ×1 IMPLANT
SLEEVE SCD COMPRESS KNEE MED (MISCELLANEOUS) IMPLANT
SLING ARM LRG ADULT FOAM STRAP (SOFTGOODS) IMPLANT
SPLINT PLASTER CAST XFAST 3X15 (CAST SUPPLIES) IMPLANT
SPLINT PLASTER XTRA FASTSET 3X (CAST SUPPLIES)
SPONGE GAUZE 4X4 12PLY STER LF (GAUZE/BANDAGES/DRESSINGS) ×3 IMPLANT
STOCKINETTE 6  STRL (DRAPES) ×2
STOCKINETTE 6 STRL (DRAPES) ×1 IMPLANT
SUT ETHIBOND 3-0 V-5 (SUTURE) IMPLANT
SUT FIBERWIRE 2-0 18 17.9 3/8 (SUTURE)
SUT MERSILENE 4 0 P 3 (SUTURE) IMPLANT
SUT PROLENE 6 0 P 1 18 (SUTURE) ×3 IMPLANT
SUT SILK 4 0 PS 2 (SUTURE) IMPLANT
SUT STEEL 4 (SUTURE) IMPLANT
SUT SUPRAMID 3-0 (SUTURE) IMPLANT
SUT SUPRAMID 4-0 (SUTURE) IMPLANT
SUT VICRYL 3-0 RB1 (SUTURE) ×3 IMPLANT
SUT VICRYL RAPIDE 4-0 (SUTURE) ×3 IMPLANT
SUT VICRYL RAPIDE 4/0 PS 2 (SUTURE) ×3 IMPLANT
SUTURE FIBERWR 2-0 18 17.9 3/8 (SUTURE) IMPLANT
SYR BULB 3OZ (MISCELLANEOUS) ×3 IMPLANT
SYRINGE 10CC LL (SYRINGE) ×3 IMPLANT
TOWEL OR 17X24 6PK STRL BLUE (TOWEL DISPOSABLE) ×3 IMPLANT
TUBE CONNECTING 20'X1/4 (TUBING)
TUBE CONNECTING 20X1/4 (TUBING) IMPLANT
TUBE FEEDING 5FR 15 INCH (TUBING) ×3 IMPLANT
UNDERPAD 30X30 (UNDERPADS AND DIAPERS) ×3 IMPLANT

## 2015-01-24 NOTE — Interval H&P Note (Signed)
History and Physical Interval Note:  01/24/2015 1:18 PM  Francis Robinson  has presented today for surgery, with the diagnosis of RIGHT RING FINGER TRAMATIC MALLET DEFORMITY  The various methods of treatment have been discussed with the patient and family. After consideration of risks, benefits and other options for treatment, the patient has consented to  Procedure(s): RIGHT RING [place] (Right) as a surgical intervention .  The patient's history has been reviewed, patient examined, no change in status, stable for surgery.  I have reviewed the patient's chart and labs.  Questions were answered to the patient's satisfaction.     [name]

## 2015-01-24 NOTE — Transfer of Care (Signed)
Immediate Anesthesia Transfer of Care Note  Patient: Francis Robinson  Procedure(s) Performed: Procedure(s): RIGHT RING FINGERMALLET REPAIR  (Right)  Patient Location: PACU  Anesthesia Type:General  Level of Consciousness: sedated  Airway & Oxygen Therapy: Patient Spontanous Breathing and Patient connected to nasal cannula oxygen  Post-op Assessment: Report given to RN and Post -op Vital signs reviewed and stable  Post vital signs: Reviewed and stable  Last Vitals:  Filed Vitals:   01/24/15 1301  BP: 178/91  Pulse: 77  Temp: 36.7 C  Resp: 18    Complications: No apparent anesthesia complications

## 2015-01-24 NOTE — Anesthesia Procedure Notes (Signed)
Procedure Name: LMA Insertion Date/Time: 01/24/2015 3:29 PM Performed by: Lieutenant Diego Pre-anesthesia Checklist: Patient identified, Emergency Drugs available, Suction available and Patient being monitored Patient Re-evaluated:Patient Re-evaluated prior to inductionOxygen Delivery Method: Circle System Utilized Preoxygenation: Pre-oxygenation with 100% oxygen Intubation Type: IV induction Ventilation: Mask ventilation without difficulty LMA: LMA inserted LMA Size: 5.0 Number of attempts: 1 Airway Equipment and Method: Bite block Placement Confirmation: positive ETCO2 and breath sounds checked- equal and bilateral Tube secured with: Tape Dental Injury: Teeth and Oropharynx as per pre-operative assessment

## 2015-01-24 NOTE — Discharge Instructions (Signed)

## 2015-01-24 NOTE — Anesthesia Postprocedure Evaluation (Signed)
  Anesthesia Post-op Note  Patient: Francis Robinson  Procedure(s) Performed: Procedure(s): RIGHT RING FINGERMALLET REPAIR  (Right)  Patient Location: PACU  Anesthesia Type: General   Level of Consciousness: awake, alert  and oriented  Airway and Oxygen Therapy: Patient Spontanous Breathing  Post-op Pain: none  Post-op Assessment: Post-op Vital signs reviewed  Post-op Vital Signs: Reviewed  Last Vitals:  Filed Vitals:   01/24/15 1645  BP: 151/97  Pulse: 75  Temp:   Resp: 15    Complications: No apparent anesthesia complications

## 2015-01-24 NOTE — Op Note (Signed)
01/24/2015  1:22 PM  PATIENT:  Francis Robinson  54 y.o. male  PRE-OPERATIVE DIAGNOSIS:  Right ring finger subacute open mallet injury  POST-OPERATIVE DIAGNOSIS:  Same  PROCEDURE:  Right ring finger terminal extensor tendon repair with pinning of the DIP joint  SURGEON: Rayvon Char. Grandville Silos, MD  PHYSICIAN ASSISTANT: Morley Kos, OPA-C  ANESTHESIA:  general  SPECIMENS:  None  DRAINS:   None  EBL:  less than 50 mL  PREOPERATIVE INDICATIONS:  Francis Robinson is a  54 y.o. male with subacute right ring finger open mallet injury with significant extensor lag.  The risks benefits and alternatives were discussed with the patient preoperatively including but not limited to the risks of infection, bleeding, nerve injury, cardiopulmonary complications, the need for revision surgery, among others, and the patient verbalized understanding and consented to proceed.  OPERATIVE IMPLANTS: 0.062 inch K wire 1  OPERATIVE PROCEDURE:  After receiving prophylactic antibiotics, the patient was escorted to the operative theatre and placed in a supine position. General anesthesia was a minister A surgical "time-out" was performed during which the planned procedure, proposed operative site, and the correct patient identity were compared to the operative consent and agreement confirmed by the circulating nurse according to current facility policy.  Following application of a tourniquet to the operative extremity, the exposed skin was prepped with Chloraprep and draped in the usual sterile fashion.  The limb was exsanguinated with an Esmarch bandage and the tourniquet inflated to approximately 182mmHg higher than systolic BP.  A portion of his previous laceration was opened, which was a U-shaped laceration with the apex pointed proximally. A fairly equal brought flap was also created that was proximally based, allowing for proximal and distal retraction of the 2 flaps. The extensor apparatus was found, with what  appeared to be some healing neo-tendon in the area of previous injury. There was adequate tissue still attached to the base of the distal phalanx to allow for direct repair. The extensor tendon was mobilized by incising it on its radial and ulnar margins longitudinally and freeing it from the underlying middle phalanx. The DIP joint was then held fully extended and pinned longitudinally with a 0.062 inch K wire which was cut off at the level of the skin, allowing the end to retract below the skin. A small elliptical central excision of extensor tendon was performed and then this was repaired with 3-0 Vicryl figure-of-eight sutures 2, affecting a good repair of the tendon and removing some of the extra redundant tendon that had been there when the joint was held in full extension. Tension on the tendon appeared good and appropriate. The wound was irrigated, half percent Marcaine with epinephrine was instilled at the base of the digit to provide for postoperative pain control and the tourniquet was released. Some additional hemostasis was obtained, and the skin was closed with 4-0 Vicryl Rapide interrupted sutures.  A finger splint dressing with a dorsal tongue blade was applied, looping around the wrist to help keep the dressing from falling off the digit, and he was awakened and taken to the recovery room in stable condition, breathing spontaneously.  DISPOSITION: He will be discharged home with typical instructions, returning in 10-15 days, at which time therapy will make a custom protective splint for him. We will likely plan to keep the pin in place for about 8-10 weeks. He needs no x-rays at his first return visit.

## 2015-01-24 NOTE — Anesthesia Preprocedure Evaluation (Signed)
Anesthesia Evaluation  Patient identified by MRN, date of birth, ID band Patient awake    Reviewed: Allergy & Precautions, NPO status , Patient's Chart, lab work & pertinent test results  Airway Mallampati: I  TM Distance: >3 FB Neck ROM: Full    Dental  (+) Teeth Intact, Dental Advisory Given   Pulmonary former smoker,  breath sounds clear to auscultation        Cardiovascular hypertension, Pt. on medications Rhythm:Regular Rate:Normal     Neuro/Psych    GI/Hepatic   Endo/Other    Renal/GU      Musculoskeletal   Abdominal   Peds  Hematology   Anesthesia Other Findings   Reproductive/Obstetrics                             Anesthesia Physical Anesthesia Plan  ASA: II  Anesthesia Plan: General   Post-op Pain Management:    Induction: Intravenous  Airway Management Planned: LMA  Additional Equipment:   Intra-op Plan:   Post-operative Plan: Extubation in OR  Informed Consent: I have reviewed the patients History and Physical, chart, labs and discussed the procedure including the risks, benefits and alternatives for the proposed anesthesia with the patient or authorized representative who has indicated his/her understanding and acceptance.   Dental advisory given  Plan Discussed with: CRNA, Anesthesiologist and Surgeon  Anesthesia Plan Comments:         Anesthesia Quick Evaluation  

## 2015-01-25 ENCOUNTER — Encounter (HOSPITAL_BASED_OUTPATIENT_CLINIC_OR_DEPARTMENT_OTHER): Payer: Self-pay | Admitting: Orthopedic Surgery

## 2015-02-21 DIAGNOSIS — E782 Mixed hyperlipidemia: Secondary | ICD-10-CM | POA: Insufficient documentation

## 2015-02-21 DIAGNOSIS — F1090 Alcohol use, unspecified, uncomplicated: Secondary | ICD-10-CM | POA: Insufficient documentation

## 2015-05-05 DIAGNOSIS — D126 Benign neoplasm of colon, unspecified: Secondary | ICD-10-CM | POA: Insufficient documentation

## 2016-04-22 HISTORY — PX: TOTAL KNEE ARTHROPLASTY: SHX125

## 2016-10-10 IMAGING — RF DG C-ARM 61-120 MIN
1 series · 2 of 2 positions shown · non-contrast
Comparison: Right hand radiographs dated 09/30/2014.

CLINICAL DATA: K-wire fixation of a right ring mallet finger.

EXAM:
RIGHT RING FINGER 2+V; DG C-ARM 61-120 MIN

[Series 1: run · 2 of 2 slices shown]
[im 1/2]
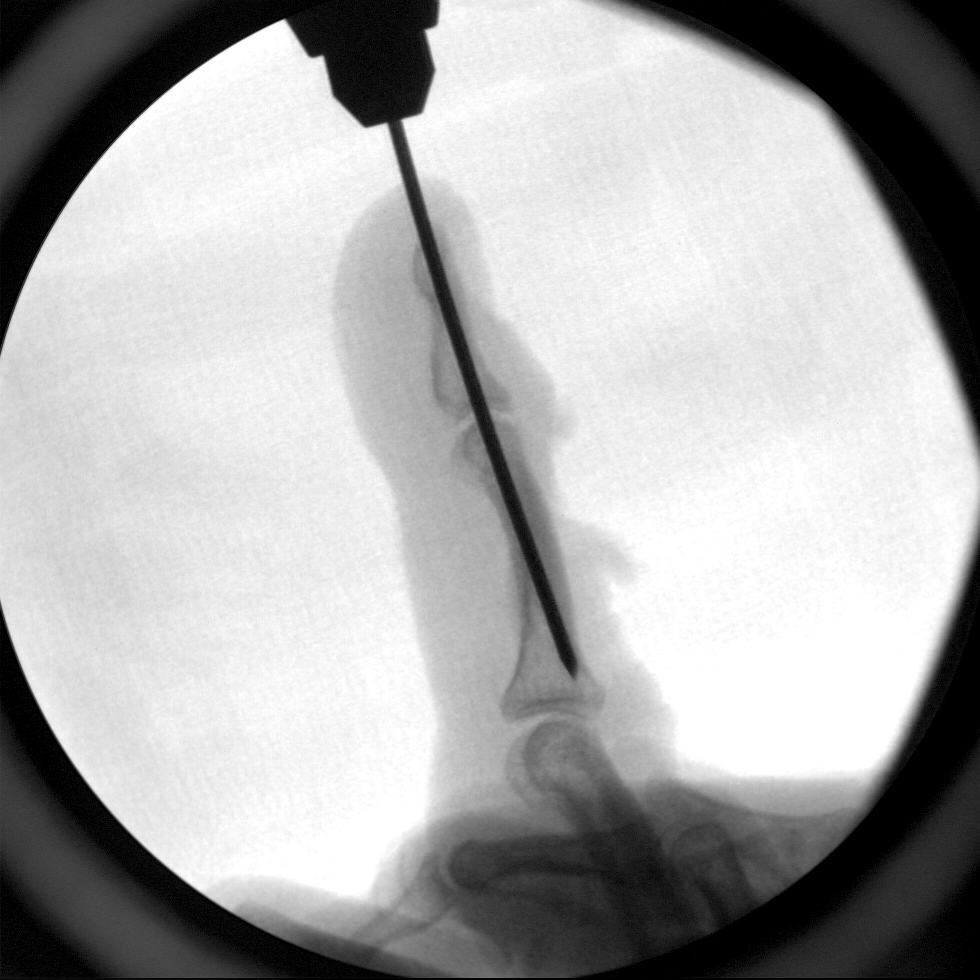
[im 2/2]
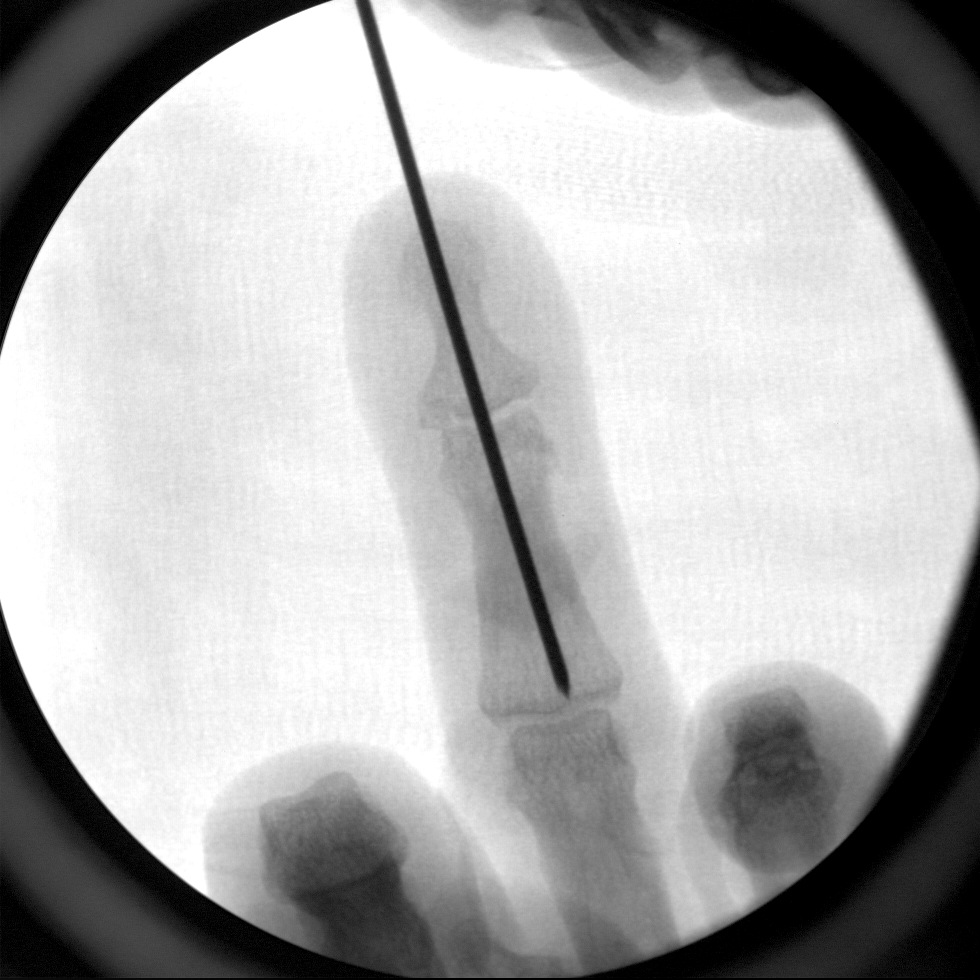

[2 of 2 positions shown; findings below may reference images not displayed]

FINDINGS: PA and lateral C arm views of the right ring finger demonstrate
placement of a K-wire bridging the fourth DIP joint with normal
position and alignment. There is dorsal soft tissue irregularity.
IMPRESSION: K-wire fixation of the fourth DIP joint.

## 2018-05-08 HISTORY — PX: COLONOSCOPY: SHX174

## 2020-04-10 DIAGNOSIS — M1712 Unilateral primary osteoarthritis, left knee: Secondary | ICD-10-CM | POA: Insufficient documentation

## 2020-06-19 ENCOUNTER — Other Ambulatory Visit: Payer: Self-pay

## 2020-06-19 ENCOUNTER — Ambulatory Visit: Payer: Managed Care, Other (non HMO) | Admitting: Dermatology

## 2020-06-19 DIAGNOSIS — L853 Xerosis cutis: Secondary | ICD-10-CM

## 2020-06-19 DIAGNOSIS — L720 Epidermal cyst: Secondary | ICD-10-CM

## 2020-06-19 DIAGNOSIS — Z1283 Encounter for screening for malignant neoplasm of skin: Secondary | ICD-10-CM | POA: Diagnosis not present

## 2020-06-19 DIAGNOSIS — L821 Other seborrheic keratosis: Secondary | ICD-10-CM

## 2020-06-19 DIAGNOSIS — L578 Other skin changes due to chronic exposure to nonionizing radiation: Secondary | ICD-10-CM | POA: Diagnosis not present

## 2020-06-19 DIAGNOSIS — D229 Melanocytic nevi, unspecified: Secondary | ICD-10-CM

## 2020-06-19 DIAGNOSIS — L814 Other melanin hyperpigmentation: Secondary | ICD-10-CM

## 2020-06-19 NOTE — Patient Instructions (Addendum)
Dry Skin Care  What causes dry skin?  Dry skin is common and results from inadequate moisture in the outer skin layers. Dry skin usually results from the excessive loss of moisture from the skin surface. This occurs due to two major factors: 1. Normally the skin's oil glands deposit a layer of oil on the skin's surface. This layer of oil prevents the loss of moisture from the skin. Exposure to soaps, cleaners, solvents, and disinfectants removes this oily film, allowing water to escape. 2. Water loss from the skin increases when the humidity is low. During winter months we spend a lot of time indoors where the air is heated. Heated air has very low humidity. This also contributes to dry skin.  A tendency for dry skin may accompany such disorders as eczema. Also, as people age, the number of functioning oil glands decreases, and the tendency toward dry skin can be a sensation of skin tightness when emerging from the shower.  How do I manage dry skin?  1. Humidify your environment. This can be accomplished by using a humidifier in your bedroom at night during winter months. 2. Bathing can actually put moisture back into your skin if done right. Take the following steps while bathing to sooth dry skin:  Avoid hot water, which only dries the skin and makes itching worse. Use warm water.  Avoid washcloths or extensive rubbing or scrubbing.  Use mild soaps like unscented Dove, Oil of Olay, Cetaphil, Basis, or CeraVe.  If you take baths rather than showers, rinse off soap residue with clean water before getting out of tub.  Once out of the shower/tub, pat dry gently with a soft towel. Leave your skin damp.  While still damp, apply any medicated ointment/cream you were prescribed to the affected areas. After you apply your medicated ointment/cream, then apply your moisturizer to your whole body.This is the most important step in dry skin care. If this is omitted, your skin will continue to be  dry.  The choice of moisturizer is also very important. In general, lotion will not provider enough moisture to severely dry skin because it is water based. You should use an ointment or cream. Moisturizers should also be unscented. Good choices include Vaseline (plain petrolatum), Aquaphor, Cetaphil, CeraVe, Vanicream, DML Forte, Aveeno moisture, or Eucerin Cream.  Bath oils can be helpful, but do not replace the application of moisturizer after the bath. In addition, they make the tub slippery causing an increased risk for falls. Therefore, we do not recommend their use. 

## 2020-06-19 NOTE — Progress Notes (Signed)
   New Patient Visit  Subjective  Francis Robinson is a 60 y.o. male who presents for the following: TBSE (Patient here to establish care. He has seen a dermatologist in the past, no history of skin cancer. He did have an aunt pass away from Melanoma. He has a few dark spots on his left upper arm he would like checked. No new or changing spots noticed.).   The following portions of the chart were reviewed this encounter and updated as appropriate:       Review of Systems:  No other skin or systemic complaints except as noted in HPI or Assessment and Plan.  Objective  Well appearing patient in no apparent distress; mood and affect are within normal limits.  A full examination was performed including scalp, head, eyes, ears, nose, lips, neck, chest, axillae, abdomen, back, buttocks, bilateral upper extremities, bilateral lower extremities, hands, feet, fingers, toes, fingernails, and toenails. All findings within normal limits unless otherwise noted below.  Objective  Left Lower Back: Subcutaneous nodule.    Assessment & Plan   Skin cancer screening performed today.  Actinic Damage - chronic, secondary to cumulative UV radiation exposure/sun exposure over time - diffuse scaly erythematous macules with underlying dyspigmentation - Recommend daily broad spectrum sunscreen SPF 30+ to sun-exposed areas, reapply every 2 hours as needed.  - Call for new or changing lesions.  Seborrheic Keratoses - Stuck-on, waxy, tan-brown macules and  papules, including left upper arm.  - Discussed benign etiology and prognosis. - Observe - Call for any changes  Lentigines - Scattered tan macules - Due to sun exposure - Benign-appering, observe - Recommend daily broad spectrum sunscreen SPF 30+ to sun-exposed areas, reapply every 2 hours as needed. - Call for any changes  Xerosis - diffuse xerotic patches - recommend gentle, hydrating skin care - gentle skin care handout given  Melanocytic  Nevi - Tan-brown and/or pink-flesh-colored symmetric macules and papules, including right neck, right axilla, right antecubitum - Benign appearing on exam today - Observation - Call clinic for new or changing moles - Recommend daily use of broad spectrum spf 30+ sunscreen to sun-exposed areas.   Epidermal inclusion cyst Left Lower Back  Benign-appearing. Exam most consistent with an epidermal inclusion cyst. Discussed that a cyst is a benign growth that can grow over time and sometimes get irritated or inflamed. Recommend observation if it is not bothersome. Discussed option of surgical excision to remove it if it is growing, symptomatic, or other changes noted. Please call for new or changing lesions so they can be evaluated.    Return in about 1 year (around 06/19/2021) for TBSE.  IJamesetta Orleans, CMA, am acting as scribe for Brendolyn Patty, MD .  Documentation: I have reviewed the above documentation for accuracy and completeness, and I agree with the above.  Brendolyn Patty MD

## 2020-09-29 DIAGNOSIS — R911 Solitary pulmonary nodule: Secondary | ICD-10-CM | POA: Insufficient documentation

## 2020-11-01 HISTORY — PX: TOTAL SHOULDER ARTHROPLASTY: SHX126

## 2020-12-05 DIAGNOSIS — M19011 Primary osteoarthritis, right shoulder: Secondary | ICD-10-CM | POA: Insufficient documentation

## 2020-12-05 DIAGNOSIS — Z87891 Personal history of nicotine dependence: Secondary | ICD-10-CM | POA: Insufficient documentation

## 2021-03-07 DIAGNOSIS — E66811 Obesity, class 1: Secondary | ICD-10-CM | POA: Insufficient documentation

## 2021-03-07 DIAGNOSIS — E669 Obesity, unspecified: Secondary | ICD-10-CM | POA: Insufficient documentation

## 2021-03-07 DIAGNOSIS — R7303 Prediabetes: Secondary | ICD-10-CM | POA: Insufficient documentation

## 2021-05-31 HISTORY — PX: TOTAL SHOULDER ARTHROPLASTY: SHX126

## 2021-06-20 DIAGNOSIS — R911 Solitary pulmonary nodule: Secondary | ICD-10-CM

## 2021-06-20 HISTORY — DX: Solitary pulmonary nodule: R91.1

## 2021-06-25 ENCOUNTER — Encounter: Payer: Self-pay | Admitting: Dermatology

## 2022-01-15 HISTORY — PX: TOTAL KNEE ARTHROPLASTY: SHX125

## 2022-02-24 NOTE — Progress Notes (Signed)
[facility] [place], [place] [address]  Pulmonary Sleep Medicine   Office Visit Note  Patient Name: Francis Robinson DOB: Sep 29, 1960 MRN [id number]    Chief Complaint: Obstructive Sleep Apnea visit  Brief History:  Francis Robinson is seen today for an initial sleep consult,  recently set up on APAP 5-20cmh20.  The patient has a 4 month history of sleep apnea. Prior to using APAP the patient reports a history of loud snoring and EDS and morning headaches.  Patient is using PAP nightly.  The patient feels rested after sleeping with PAP.  The patient reports benefiting from PAP use. Reported sleepiness is the same and the Epworth Sleepiness Score is 11 out of 24. The patient sometimes takes naps about 1 time per week for about an hour. The patient complains of the following: some nasal irritation, dry mouth and frequent awakenings.  The compliance download shows 87%  compliance with an average use time of 5 hours 53 min. The AHI is 8.2  The patient does not complain of limb movements disrupting sleep. Patient reports that he goes to bed around 10pm and wakes to start his day at 7am.   ROS  General: (-) fever, (-) chills, (-) night sweat Nose and Sinuses: (-) nasal stuffiness or itchiness, (-) postnasal drip, (-) nosebleeds, (-) sinus trouble. Mouth and Throat: (-) sore throat, (-) hoarseness. Neck: (-) swollen glands, (-) enlarged thyroid, (-) neck pain. Respiratory: - cough, - shortness of breath, - wheezing. Neurologic: - numbness, - tingling. Psychiatric: - anxiety, - depression   Current Medication: Outpatient Encounter Medications as of 02/25/2022  Medication Sig   acetaminophen (TYLENOL) 500 MG tablet Take by mouth.   carvedilol (COREG) 6.25 MG tablet Take by mouth.   lisinopril-hydrochlorothiazide (ZESTORETIC) 20-25 MG tablet Take 1 tablet by mouth daily.   spironolactone (ALDACTONE) 25 MG tablet Take 1 tablet by mouth daily.   [DISCONTINUED]  lisinopril-hydrochlorothiazide (ZESTORETIC) 20-12.5 MG tablet Take 1 tablet by mouth 2 (two) times daily.   [DISCONTINUED] naproxen sodium (ANAPROX) 220 MG tablet Take 220 mg by mouth 2 (two) times daily with a meal.   No facility-administered encounter medications on file as of 02/25/2022.    Surgical History: Past Surgical History:  Procedure Laterality Date   HAND SURGERY Left    KNEE SURGERY Right    REPAIR EXTENSOR TENDON Right 01/24/2015   Procedure: RIGHT RING FINGERMALLET REPAIR ;  Surgeon: Milly Jakob, MD;  Location: [place];  Service: Orthopedics;  Laterality: Right;   TONSILLECTOMY     TOTAL KNEE ARTHROPLASTY Left     Medical History: Past Medical History:  Diagnosis Date   Hypertension    Rheumatoid arthritis ([place])     Family History: Non contributory to the present illness  Social History: Social History   Socioeconomic History   Marital status: Married    Spouse name: Not on file   Number of children: Not on file   Years of education: Not on file   Highest education level: Not on file  Occupational History   Not on file  Tobacco Use   Smoking status: Former    Types: Cigarettes    Quit date: 04/22/2004    Years since quitting: 17.8   Smokeless tobacco: Former  Substance and Sexual Activity   Alcohol use: Yes    Alcohol/week: 35.0 standard drinks of alcohol    Types: 35 Cans of beer per week   Drug use: No   Sexual activity: Not  on file  Other Topics Concern   Not on file  Social History Narrative   Not on file   Social Determinants of Health   Financial Resource Strain: Not on file  Food Insecurity: Not on file  Transportation Needs: Not on file  Physical Activity: Not on file  Stress: Not on file  Social Connections: Not on file  Intimate Partner Violence: Not on file    Vital Signs: Blood pressure 122/71, pulse (!) 54, resp. rate 16, height '5\' 10"'$  (1.778 m), weight 247 lb (112 kg), SpO2 97 %. Body mass index is  35.44 kg/m.    Examination: General Appearance: The patient is well-developed, well-nourished, and in no distress. Neck Circumference: 59 cm Skin: Gross inspection of skin unremarkable. Head: normocephalic, no gross deformities. Eyes: no gross deformities noted. ENT: ears appear grossly normal Neurologic: Alert and oriented. No involuntary movements.  STOP BANG RISK ASSESSMENT S (snore) Have you been told that you snore?     NO   T (tired) Are you often tired, fatigued, or sleepy during the day?   YES  O (obstruction) Do you stop breathing, choke, or gasp during sleep? NO   P (pressure) Do you have or are you being treated for high blood pressure? YES   B (BMI) Is your body index greater than 35 kg/m? YES   A (age) Are you 39 years old or older? YES   N (neck) Do you have a neck circumference greater than 16 inches?   YES   G (gender) Are you a male? YES   TOTAL STOP/BANG "YES" ANSWERS 6       A STOP-Bang score of 2 or less is considered low risk, and a score of 5 or more is high risk for having either moderate or severe OSA. For people who score 3 or 4, doctors may need to perform further assessment to determine how likely they are to have OSA.         EPWORTH SLEEPINESS SCALE:  Scale:  (0)= no chance of dozing; (1)= slight chance of dozing; (2)= moderate chance of dozing; (3)= high chance of dozing  Chance  Situtation    Sitting and reading: 2    Watching TV: 3    Sitting Inactive in public: 1    As a passenger in car: 2      Lying down to rest: 2    Sitting and talking: 0    Sitting quielty after lunch: 1    In a car, stopped in traffic: 0   TOTAL SCORE:   11 out of 24    SLEEP STUDIES:  HST (10/05/21) AHI 24.1, min SP02 78%   CPAP COMPLIANCE DATA:  Date Range: 01/23/22-02/21/2022   Average Daily Use: 5 hours 53 min  Median Use: 5 hrs 54 min  Compliance for > 4 Hours: 87% days  AHI: 8.2 respiratory events per hour  Days Used:  28/30  Mask Leak: 36.2  95th Percentile Pressure: 8.9         LABS: No results found for this or any previous visit (from the past 2160 hour(s)).  Radiology: DG C-Arm 1-60 Min  Result Date: 01/24/2015 CLINICAL DATA:  K-wire fixation of a right ring mallet finger. EXAM: RIGHT RING FINGER 2+V; DG C-ARM 61-120 MIN COMPARISON:  Right hand radiographs dated 09/30/2014. FINDINGS: PA and lateral C arm views of the right ring finger demonstrate placement of a K-wire bridging the fourth DIP joint with normal position and alignment. There  is dorsal soft tissue irregularity. IMPRESSION: K-wire fixation of the fourth DIP joint. Electronically Signed   By: Claudie Revering M.D.   On: 01/24/2015 16:40   DG Finger Ring Right  Result Date: 01/24/2015 CLINICAL DATA:  K-wire fixation of a right ring mallet finger. EXAM: RIGHT RING FINGER 2+V; DG C-ARM 61-120 MIN COMPARISON:  Right hand radiographs dated 09/30/2014. FINDINGS: PA and lateral C arm views of the right ring finger demonstrate placement of a K-wire bridging the fourth DIP joint with normal position and alignment. There is dorsal soft tissue irregularity. IMPRESSION: K-wire fixation of the fourth DIP joint. Electronically Signed   By: Claudie Revering M.D.   On: 01/24/2015 16:40    No results found.  No results found.    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 Complex sleep apnea syndrome The patient does tolerate PAP and reports  benefit from PAP use. His apnea is not controlled and he is having predominantly central apneas. This may be treatment emergent, but he also drinks alcohol heavily and was taking opiates up until a week or two ago. He also is mouth venting. We will schedule a mask fit for full face mask, reduce pressure to 5 to 10, do a 2 week download after mask fit, and if his apnea remains uncontrolled at that time he will need a cpap/bipap titration as he continues to have some daytime sleepiness as well as uncontrolled apneas, predominantly centrals. The patient was reminded how to clean equipment and advised to replace supplies routinely. The patient was also counselled on weight loss and alcohol consumption. . The compliance is good. The AHI is 8.2.   Complex sleep apnea syndrome- not controlled. Pap therapy continues to be medically necessary to treat the patient's apnea. Adjust pressure to 5 to 10, mask fit to full face mask, and do 2w download. If uncontrolled, will need cpap/bipap titration.    2. CPAP use counseling CPAP Counseling: had a lengthy discussion with the patient regarding the importance of PAP therapy in management of the sleep apnea. Patient appears to understand the risk factor reduction and also understands the risks associated with untreated sleep apnea. Patient will try to make a good faith effort to remain compliant with therapy. Also instructed the patient on proper cleaning of the device including the water must be changed daily if possible and use of distilled water is preferred. Patient understands that the machine should be regularly cleaned with appropriate recommended cleaning solutions that do not damage the PAP machine for example given white vinegar and water rinses. Other methods such as ozone treatment may not be as good as these simple methods to achieve cleaning.      General Counseling: I have discussed the findings of the evaluation and examination with [place].  I have also discussed any further diagnostic evaluation thatmay be needed or ordered today. Delane verbalizes understanding of the  findings of todays visit. We also reviewed his medications today and discussed drug interactions and side effects including but not limited excessive drowsiness and altered mental states. We also discussed that there is always a risk not just to him but also people around him. he has been encouraged to call the office with any questions or concerns that should arise related to todays visit.  No orders of the defined types were placed in this encounter.       I have personally obtained a history, examined the patient, evaluated laboratory and imaging results, formulated the assessment and plan and placed orders. This patient was seen today by Tressie Ellis, PA-C in collaboration with Dr. Devona Konig.   Allyne Gee, MD [facility] Diplomate ABMS Pulmonary Critical Care Medicine and Sleep Medicine

## 2022-02-25 ENCOUNTER — Ambulatory Visit (INDEPENDENT_AMBULATORY_CARE_PROVIDER_SITE_OTHER): Payer: Managed Care, Other (non HMO) | Admitting: Internal Medicine

## 2022-02-25 VITALS — BP 122/71 | HR 54 | Resp 16 | Ht 70.0 in | Wt 247.0 lb

## 2022-02-25 DIAGNOSIS — M0600 Rheumatoid arthritis without rheumatoid factor, unspecified site: Secondary | ICD-10-CM | POA: Insufficient documentation

## 2022-02-25 DIAGNOSIS — G4731 Primary central sleep apnea: Secondary | ICD-10-CM | POA: Diagnosis not present

## 2022-02-25 DIAGNOSIS — Z7189 Other specified counseling: Secondary | ICD-10-CM

## 2022-02-25 NOTE — Patient Instructions (Signed)

## 2022-09-18 ENCOUNTER — Other Ambulatory Visit: Payer: Self-pay | Admitting: Internal Medicine

## 2022-09-18 DIAGNOSIS — I2089 Other forms of angina pectoris: Secondary | ICD-10-CM

## 2022-09-25 ENCOUNTER — Telehealth (HOSPITAL_COMMUNITY): Payer: Self-pay | Admitting: *Deleted

## 2022-09-25 ENCOUNTER — Telehealth (HOSPITAL_COMMUNITY): Payer: Self-pay | Admitting: Emergency Medicine

## 2022-09-25 ENCOUNTER — Encounter (HOSPITAL_COMMUNITY): Payer: Self-pay

## 2022-09-25 NOTE — Telephone Encounter (Signed)
Attempted to call patient regarding upcoming cardiac CT appointment. °Left message on voicemail with name and callback number ° °Jeffie Spivack RN Navigator Cardiac Imaging °[facility] °[phone] Office °[phone] Cell ° °

## 2022-09-25 NOTE — Telephone Encounter (Signed)
Reaching out to patient to offer assistance regarding upcoming cardiac imaging study; pt verbalizes understanding of appt date/time, parking situation and where to check in, pre-test NPO status and medications ordered, and verified current allergies; name and call back number provided for further questions should they arise Aphrodite Harpenau RN Navigator Cardiac Imaging [facility] [phone] office [phone] cell 

## 2022-09-26 ENCOUNTER — Ambulatory Visit
Admission: RE | Admit: 2022-09-26 | Discharge: 2022-09-26 | Disposition: A | Discharge status: Home / Self Care | Payer: 59 | Source: Ambulatory Visit | Attending: Internal Medicine | Admitting: Internal Medicine

## 2022-09-26 DIAGNOSIS — I2089 Other forms of angina pectoris: Secondary | ICD-10-CM | POA: Insufficient documentation

## 2022-09-26 DIAGNOSIS — I2510 Atherosclerotic heart disease of native coronary artery without angina pectoris: Secondary | ICD-10-CM

## 2022-09-26 HISTORY — DX: Atherosclerotic heart disease of native coronary artery without angina pectoris: I25.10

## 2022-09-26 MED ORDER — NITROGLYCERIN 0.4 MG SL SUBL
0.8000 mg | SUBLINGUAL_TABLET | Freq: Once | SUBLINGUAL | Status: AC
  Administered 2022-09-26: 0.8 mg via SUBLINGUAL

## 2022-09-26 MED ORDER — IOHEXOL 350 MG/ML SOLN
100.0000 mL | Freq: Once | INTRAVENOUS | Status: AC | PRN
  Administered 2022-09-26: 100 mL via INTRAVENOUS

## 2022-09-26 MED ORDER — METOPROLOL TARTRATE 5 MG/5ML IV SOLN
10.0000 mg | Freq: Once | INTRAVENOUS | Status: AC
  Administered 2022-09-26: 10 mg via INTRAVENOUS

## 2022-09-26 NOTE — Progress Notes (Signed)
Patient tolerated CT well. Drank water after. Vital signs stable encourage to drink water throughout day.Reasons explained and verbalized understanding. Ambulated steady gait.  

## 2022-10-14 ENCOUNTER — Ambulatory Visit: Payer: Managed Care, Other (non HMO)

## 2022-12-22 DIAGNOSIS — K649 Unspecified hemorrhoids: Secondary | ICD-10-CM

## 2022-12-22 HISTORY — DX: Unspecified hemorrhoids: K64.9

## 2022-12-30 ENCOUNTER — Ambulatory Visit: Payer: Self-pay | Admitting: Surgery

## 2022-12-30 NOTE — H&P (Signed)
Alternatives include the options of observation, medical management.  Benefits include symptomatic relief.  I discussed  in detail and the complications related to the operation and the anesthesia, including bleeding, infection, recurrence, remote possibility of temporary or permanent fecal incontinence, poor/delayed wound healing, chronic pain, and additional procedures to address said risks. The risks of general anesthetic, if used, includes MI, CVA, sudden death or even reaction to anesthetic medications also discussed.   We also discussed typical post operative recovery which includes weeks to potentially months of anal pain, drainage, occasional bleeding, and sense of fecal urgency.    ED return precautions given for sudden increase in pain, bleeding, with possible accompanying fever, nausea, and/or vomiting.  The patient understands the risks, any and all questions were answered to the patient's satisfaction.  2. Patient has elected to proceed with surgical treatment. Procedure will be scheduled.

## 2023-01-07 ENCOUNTER — Encounter
Admission: RE | Admit: 2023-01-07 | Discharge: 2023-01-07 | Disposition: A | Discharge status: Home / Self Care | Payer: 59 | Source: Ambulatory Visit | Attending: Surgery | Admitting: Surgery

## 2023-01-07 ENCOUNTER — Encounter: Payer: Self-pay | Admitting: Urgent Care

## 2023-01-07 ENCOUNTER — Other Ambulatory Visit: Payer: Self-pay

## 2023-01-07 VITALS — Ht 70.0 in | Wt 232.0 lb

## 2023-01-07 DIAGNOSIS — Z01812 Encounter for preprocedural laboratory examination: Secondary | ICD-10-CM | POA: Diagnosis present

## 2023-01-07 HISTORY — DX: Mixed hyperlipidemia: E78.2

## 2023-01-07 HISTORY — DX: Prediabetes: R73.03

## 2023-01-07 HISTORY — DX: Essential (primary) hypertension: I10

## 2023-01-07 HISTORY — DX: Polyneuropathy, unspecified: G62.9

## 2023-01-07 HISTORY — DX: Benign neoplasm of colon, unspecified: D12.6

## 2023-01-07 LAB — BASIC METABOLIC PANEL
Anion gap: 10 (ref 5–15)
BUN: 16 mg/dL (ref 8–23)
CO2: 25 mmol/L (ref 22–32)
Calcium: 9 mg/dL (ref 8.9–10.3)
Chloride: 103 mmol/L (ref 98–111)
Creatinine, Ser: 0.71 mg/dL (ref 0.61–1.24)
GFR, Estimated: >60 mL/min (ref >60)
Glucose, Bld: 95 mg/dL (ref 70–99)
Potassium: 4.1 mmol/L (ref 3.5–5.1)
Sodium: 138 mmol/L (ref 135–145)

## 2023-01-07 NOTE — Patient Instructions (Addendum)
Your procedure is scheduled on: Thursday, September 19 Report to the Registration Desk on the 1st floor of the [facility] To find out your arrival time, please call [phone] between 1PM - 3PM on: Wednesday, September 18 If your arrival time is 6:00 am, do not arrive before that time as the [facility] entrance doors do not open until 6:00 am.  REMEMBER: Instructions that are not followed completely may result in serious medical risk, up to and including death; or upon the discretion of your surgeon and anesthesiologist your surgery may need to be rescheduled.  Do not eat food after midnight the night before surgery.  No gum chewing or hard candies.  You may however, drink CLEAR liquids up to 2 hours before you are scheduled to arrive for your surgery. Do not drink anything within 2 hours of your scheduled arrival time.  Clear liquids include: - water  - apple juice without pulp - gatorade (not RED colors) - black coffee or tea (Do NOT add milk or creamers to the coffee or tea) Do NOT drink anything that is not on this list.  One week prior to surgery: starting today, September 17 Stop Anti-inflammatories (NSAIDS) such as Advil, Aleve, Ibuprofen, Motrin, Naproxen, Naprosyn and Aspirin based products such as Excedrin, Goody's Powder, BC Powder. Stop ANY OVER THE COUNTER supplements until after surgery. You may however, continue to take Tylenol if needed for pain up until the day of surgery.  Continue taking all prescribed medications   TAKE ONLY THESE MEDICATIONS THE MORNING OF SURGERY WITH A SIP OF WATER:  Carvedilol  No Alcohol for 24 hours before or after surgery.  No Smoking including e-cigarettes for 24 hours before surgery.  No chewable tobacco products for at least 6 hours before surgery.  No nicotine patches on the day of surgery.  Do not use any "recreational" drugs for at least a week (preferably 2 weeks) before your surgery.  Please be advised that the  combination of cocaine and anesthesia may have negative outcomes, up to and including death. If you test positive for cocaine, your surgery will be cancelled.  On the morning of surgery brush your teeth with toothpaste and water, you may rinse your mouth with mouthwash if you wish. Do not swallow any toothpaste or mouthwash.  Do not wear jewelry, make-up, hairpins, clips or nail polish.  For welded (permanent) jewelry: bracelets, anklets, waist bands, etc.  Please have this removed prior to surgery.  If it is not removed, there is a chance that hospital personnel will need to cut it off on the day of surgery.  Do not wear lotions, powders, or perfumes.   Do not shave body hair from the neck down 48 hours before surgery.  Contact lenses, hearing aids and dentures may not be worn into surgery.  Do not bring valuables to the hospital. [facility] is not responsible for any missing/lost belongings or valuables.   Notify your doctor if there is any change in your medical condition (cold, fever, infection).  Wear comfortable clothing (specific to your surgery type) to the hospital.  After surgery, you can help prevent lung complications by doing breathing exercises.  Take deep breaths and cough every 1-2 hours.   If you are being discharged the day of surgery, you will not be allowed to drive home. You will need a responsible individual to drive you home and stay with you for 24 hours after surgery.   If you are taking public transportation, you  will need to have a responsible individual with you.  Please call the Pre-admissions Testing Dept. at [phone] if you have any questions about these instructions.  Surgery Visitation Policy:  Patients having surgery or a procedure may have two visitors.  Children under the age of 21 must have an adult with them who is not the patient.

## 2023-01-09 ENCOUNTER — Other Ambulatory Visit: Payer: Self-pay

## 2023-01-09 ENCOUNTER — Ambulatory Visit
Admission: RE | Admit: 2023-01-09 | Discharge: 2023-01-09 | Disposition: A | Discharge status: Home / Self Care | Payer: 59 | Attending: Surgery | Admitting: Surgery

## 2023-01-09 ENCOUNTER — Ambulatory Visit: Payer: 59 | Admitting: Urgent Care

## 2023-01-09 ENCOUNTER — Ambulatory Visit: Payer: Self-pay | Admitting: Urgent Care

## 2023-01-09 ENCOUNTER — Encounter: Payer: Self-pay | Admitting: Surgery

## 2023-01-09 ENCOUNTER — Encounter
Admission: RE | Disposition: A | Discharge status: Home / Self Care | Payer: Self-pay | Source: Home / Self Care | Attending: Surgery

## 2023-01-09 DIAGNOSIS — G4730 Sleep apnea, unspecified: Secondary | ICD-10-CM | POA: Diagnosis not present

## 2023-01-09 DIAGNOSIS — K643 Fourth degree hemorrhoids: Secondary | ICD-10-CM | POA: Diagnosis present

## 2023-01-09 DIAGNOSIS — Z87891 Personal history of nicotine dependence: Secondary | ICD-10-CM | POA: Diagnosis not present

## 2023-01-09 DIAGNOSIS — I10 Essential (primary) hypertension: Secondary | ICD-10-CM | POA: Diagnosis not present

## 2023-01-09 HISTORY — PX: HEMORRHOID SURGERY: SHX153

## 2023-01-09 SURGERY — HEMORRHOIDECTOMY
Anesthesia: General | Site: Rectum

## 2023-01-09 MED ORDER — ACETAMINOPHEN 500 MG PO TABS
1000.0000 mg | ORAL_TABLET | ORAL | Status: AC
  Administered 2023-01-09: 1000 mg via ORAL

## 2023-01-09 MED ORDER — 0.9 % SODIUM CHLORIDE (POUR BTL) OPTIME
TOPICAL | Status: DC | PRN
  Administered 2023-01-09: 200 mL

## 2023-01-09 MED ORDER — HYDROCODONE-ACETAMINOPHEN 5-325 MG PO TABS: 1.0000 | ORAL_TABLET | Freq: Four times a day (QID) | ORAL | 0 refills | Status: DC | PRN

## 2023-01-09 MED ORDER — BUPIVACAINE HCL (PF) 0.5 % IJ SOLN
INTRAMUSCULAR | Status: AC
  Filled 2023-01-09: qty 30

## 2023-01-09 MED ORDER — EPHEDRINE SULFATE (PRESSORS) 50 MG/ML IJ SOLN
INTRAMUSCULAR | Status: DC | PRN
  Administered 2023-01-09 (×3): 5 mg via INTRAVENOUS

## 2023-01-09 MED ORDER — PHENYLEPHRINE 80 MCG/ML (10ML) SYRINGE FOR IV PUSH (FOR BLOOD PRESSURE SUPPORT)
PREFILLED_SYRINGE | INTRAVENOUS | Status: AC
  Filled 2023-01-09: qty 10

## 2023-01-09 MED ORDER — BUPIVACAINE LIPOSOME 1.3 % IJ SUSP
INTRAMUSCULAR | Status: AC
  Filled 2023-01-09: qty 20

## 2023-01-09 MED ORDER — HYDROMORPHONE HCL 1 MG/ML IJ SOLN
INTRAMUSCULAR | Status: AC
  Filled 2023-01-09: qty 1

## 2023-01-09 MED ORDER — CHLORHEXIDINE GLUCONATE CLOTH 2 % EX PADS: 6.0000 | MEDICATED_PAD | Freq: Once | CUTANEOUS | Status: DC

## 2023-01-09 MED ORDER — DOCUSATE SODIUM 100 MG PO CAPS: 100.0000 mg | ORAL_CAPSULE | Freq: Two times a day (BID) | ORAL | 0 refills | Status: AC | PRN

## 2023-01-09 MED ORDER — PROPOFOL 10 MG/ML IV BOLUS
INTRAVENOUS | Status: DC | PRN
  Administered 2023-01-09: 200 mg via INTRAVENOUS

## 2023-01-09 MED ORDER — ACETAMINOPHEN 325 MG PO TABS: 650.0000 mg | ORAL_TABLET | Freq: Three times a day (TID) | ORAL | 0 refills | Status: AC | PRN

## 2023-01-09 MED ORDER — FAMOTIDINE 20 MG PO TABS
20.0000 mg | ORAL_TABLET | Freq: Once | ORAL | Status: AC
  Administered 2023-01-09: 20 mg via ORAL

## 2023-01-09 MED ORDER — MIDAZOLAM HCL 2 MG/2ML IJ SOLN
INTRAMUSCULAR | Status: DC | PRN
  Administered 2023-01-09: 2 mg via INTRAVENOUS

## 2023-01-09 MED ORDER — FENTANYL CITRATE (PF) 100 MCG/2ML IJ SOLN
INTRAMUSCULAR | Status: DC | PRN
  Administered 2023-01-09: 100 ug via INTRAVENOUS

## 2023-01-09 MED ORDER — CELECOXIB 200 MG PO CAPS
ORAL_CAPSULE | ORAL | Status: AC
  Filled 2023-01-09: qty 1

## 2023-01-09 MED ORDER — BUPIVACAINE-EPINEPHRINE (PF) 0.5% -1:200000 IJ SOLN
INTRAMUSCULAR | Status: DC | PRN
  Administered 2023-01-09: 50 mL

## 2023-01-09 MED ORDER — GELATIN ABSORBABLE 12-7 MM EX MISC
CUTANEOUS | Status: DC | PRN
  Administered 2023-01-09: 1

## 2023-01-09 MED ORDER — FENTANYL CITRATE (PF) 100 MCG/2ML IJ SOLN: 25.0000 ug | INTRAMUSCULAR | Status: DC | PRN

## 2023-01-09 MED ORDER — CHLORHEXIDINE GLUCONATE 0.12 % MT SOLN
15.0000 mL | Freq: Once | OROMUCOSAL | Status: AC
  Administered 2023-01-09: 15 mL via OROMUCOSAL

## 2023-01-09 MED ORDER — FENTANYL CITRATE (PF) 100 MCG/2ML IJ SOLN
INTRAMUSCULAR | Status: AC
  Filled 2023-01-09: qty 2

## 2023-01-09 MED ORDER — EPINEPHRINE PF 1 MG/ML IJ SOLN
INTRAMUSCULAR | Status: AC
  Filled 2023-01-09: qty 1

## 2023-01-09 MED ORDER — CHLORHEXIDINE GLUCONATE 0.12 % MT SOLN
OROMUCOSAL | Status: AC
  Filled 2023-01-09: qty 15

## 2023-01-09 MED ORDER — ORAL CARE MOUTH RINSE: 15.0000 mL | Freq: Once | OROMUCOSAL | Status: AC

## 2023-01-09 MED ORDER — FAMOTIDINE 20 MG PO TABS
ORAL_TABLET | ORAL | Status: AC
  Filled 2023-01-09: qty 1

## 2023-01-09 MED ORDER — GELATIN ABSORBABLE 12-7 MM EX MISC
CUTANEOUS | Status: AC
  Filled 2023-01-09: qty 1

## 2023-01-09 MED ORDER — MIDAZOLAM HCL 2 MG/2ML IJ SOLN
INTRAMUSCULAR | Status: AC
  Filled 2023-01-09: qty 2

## 2023-01-09 MED ORDER — LACTATED RINGERS IV SOLN: INTRAVENOUS | Status: DC

## 2023-01-09 MED ORDER — EPHEDRINE 5 MG/ML INJ
INTRAVENOUS | Status: AC
  Filled 2023-01-09: qty 5

## 2023-01-09 MED ORDER — HYDROMORPHONE HCL 1 MG/ML IJ SOLN
INTRAMUSCULAR | Status: DC | PRN
Start: 2023-01-09 — End: 2023-01-09
  Administered 2023-01-09: .2 mg via INTRAVENOUS
  Administered 2023-01-09: .5 mg via INTRAVENOUS
  Administered 2023-01-09: .3 mg via INTRAVENOUS

## 2023-01-09 MED ORDER — PHENYLEPHRINE HCL (PRESSORS) 10 MG/ML IV SOLN
INTRAVENOUS | Status: DC | PRN
  Administered 2023-01-09 (×6): 80 ug via INTRAVENOUS

## 2023-01-09 MED ORDER — LIDOCAINE 5 % EX OINT: 1.0000 | TOPICAL_OINTMENT | Freq: Three times a day (TID) | CUTANEOUS | 0 refills | Status: AC | PRN

## 2023-01-09 MED ORDER — DEXAMETHASONE SODIUM PHOSPHATE 10 MG/ML IJ SOLN
INTRAMUSCULAR | Status: DC | PRN
  Administered 2023-01-09: 10 mg via INTRAVENOUS

## 2023-01-09 MED ORDER — IBUPROFEN 800 MG PO TABS: 800.0000 mg | ORAL_TABLET | Freq: Three times a day (TID) | ORAL | 0 refills | Status: DC | PRN

## 2023-01-09 MED ORDER — ONDANSETRON HCL 4 MG/2ML IJ SOLN
INTRAMUSCULAR | Status: DC | PRN
  Administered 2023-01-09: 4 mg via INTRAVENOUS

## 2023-01-09 MED ORDER — CELECOXIB 200 MG PO CAPS
200.0000 mg | ORAL_CAPSULE | ORAL | Status: AC
  Administered 2023-01-09: 200 mg via ORAL

## 2023-01-09 MED ORDER — DROPERIDOL 2.5 MG/ML IJ SOLN: 0.6250 mg | Freq: Once | INTRAMUSCULAR | Status: DC | PRN

## 2023-01-09 MED ORDER — LACTATED RINGERS IV SOLN: INTRAVENOUS | Status: DC | PRN

## 2023-01-09 MED ORDER — LIDOCAINE HCL (CARDIAC) PF 100 MG/5ML IV SOSY
PREFILLED_SYRINGE | INTRAVENOUS | Status: DC | PRN
  Administered 2023-01-09: 100 mg via INTRAVENOUS

## 2023-01-09 MED ORDER — ACETAMINOPHEN 500 MG PO TABS
ORAL_TABLET | ORAL | Status: AC
  Filled 2023-01-09: qty 2

## 2023-01-09 SURGICAL SUPPLY — 31 items
BLADE SURG 15 STRL LF DISP TIS (BLADE) ×1 IMPLANT
BLADE SURG 15 STRL SS (BLADE) ×1
BRIEF MESH DISP 2XL (UNDERPADS AND DIAPERS) ×1 IMPLANT
DRAPE PERI LITHO V/GYN (MISCELLANEOUS) ×1 IMPLANT
DRAPE UNDER BUTTOCK W/FLU (DRAPES) ×1 IMPLANT
ELECT REM PT RETURN 9FT ADLT (ELECTROSURGICAL) ×1
ELECTRODE REM PT RTRN 9FT ADLT (ELECTROSURGICAL) ×1 IMPLANT
GAUZE 4X4 16PLY ~~LOC~~+RFID DBL (SPONGE) ×1 IMPLANT
GAUZE SPONGE 4X4 12PLY STRL (GAUZE/BANDAGES/DRESSINGS) IMPLANT
GLOVE BIOGEL PI IND STRL 7.0 (GLOVE) ×1 IMPLANT
GLOVE SURG SYN 6.5 ES PF (GLOVE) ×1 IMPLANT
GLOVE SURG SYN 6.5 PF PI (GLOVE) ×1 IMPLANT
GOWN STRL REUS W/ TWL LRG LVL3 (GOWN DISPOSABLE) ×2 IMPLANT
GOWN STRL REUS W/TWL LRG LVL3 (GOWN DISPOSABLE) ×2
KIT TURNOVER CYSTO (KITS) ×1 IMPLANT
LABEL OR SOLS (LABEL) ×1 IMPLANT
MANIFOLD NEPTUNE II (INSTRUMENTS) ×1 IMPLANT
NDL HYPO 22X1.5 SAFETY MO (MISCELLANEOUS) ×1 IMPLANT
NEEDLE HYPO 22X1.5 SAFETY MO (MISCELLANEOUS) ×1 IMPLANT
NS IRRIG 500ML POUR BTL (IV SOLUTION) ×1 IMPLANT
PACK BASIN MINOR ARMC (MISCELLANEOUS) ×1 IMPLANT
PAD PREP OB/GYN DISP 24X41 (PERSONAL CARE ITEMS) ×1 IMPLANT
SHEARS HARMONIC 9CM CVD (BLADE) IMPLANT
SOL PREP PVP 2OZ (MISCELLANEOUS) ×1
SOLUTION PREP PVP 2OZ (MISCELLANEOUS) ×1 IMPLANT
SURGILUBE 2OZ TUBE FLIPTOP (MISCELLANEOUS) ×1 IMPLANT
SUT VIC AB 3-0 SH 27 (SUTURE) ×1
SUT VIC AB 3-0 SH 27X BRD (SUTURE) ×1 IMPLANT
SYR 10ML LL (SYRINGE) ×1 IMPLANT
SYR 20ML LL LF (SYRINGE) ×1 IMPLANT
TRAP FLUID SMOKE EVACUATOR (MISCELLANEOUS) ×1 IMPLANT

## 2023-01-09 NOTE — Anesthesia Postprocedure Evaluation (Signed)
Anesthesia Post Note  Patient: Francis Robinson  Procedure(s) Performed: HEMORRHOIDECTOMY (Rectum)  Patient location during evaluation: PACU Anesthesia Type: General Level of consciousness: awake and alert Pain management: pain level controlled Vital Signs Assessment: post-procedure vital signs reviewed and stable Respiratory status: spontaneous breathing, nonlabored ventilation, respiratory function stable and patient connected to nasal cannula oxygen Cardiovascular status: blood pressure returned to baseline and stable Postop Assessment: no apparent nausea or vomiting Anesthetic complications: no   No notable events documented.   Last Vitals:  Vitals:   01/09/23 0915 01/09/23 0930  BP: 121/86 121/89  Pulse: (!) 55 (!) 58  Resp: 13 18  Temp:  36.4 C  SpO2: 96% 95%    Last Pain:  Vitals:   01/09/23 0930  TempSrc:   PainSc: 0-No pain                 Lenard Simmer

## 2023-01-09 NOTE — Anesthesia Preprocedure Evaluation (Signed)
Anesthesia Evaluation  Patient identified by MRN, date of birth, ID band Patient awake    Reviewed: Allergy & Precautions, H&P , NPO status , Patient's Chart, lab work & pertinent test results, reviewed documented beta blocker date and time   History of Anesthesia Complications Negative for: history of anesthetic complications  Airway Mallampati: II  TM Distance: >3 FB Neck ROM: full    Dental  (+) Dental Advidsory Given, Caps, Teeth Intact   Pulmonary neg shortness of breath, sleep apnea and Continuous Positive Airway Pressure Ventilation , neg COPD, neg recent URI, former smoker   Pulmonary exam normal breath sounds clear to auscultation       Cardiovascular Exercise Tolerance: Good hypertension, (-) angina (-) Past MI and (-) Cardiac Stents Normal cardiovascular exam(-) dysrhythmias (-) Valvular Problems/Murmurs Rhythm:regular Rate:Normal     Neuro/Psych negative neurological ROS  negative psych ROS   GI/Hepatic negative GI ROS, Neg liver ROS,,,  Endo/Other  negative endocrine ROS    Renal/GU negative Renal ROS  negative genitourinary   Musculoskeletal   Abdominal   Peds  Hematology negative hematology ROS (+)   Anesthesia Other Findings Past Medical History: No date: Adenomatous colon polyp No date: Complex sleep apnea syndrome No date: Essential hypertension 12/2022: Hemorrhoids 06/2021: Incidental lung nodule, less than or equal to 3mm No date: Mixed hyperlipidemia No date: Peripheral neuropathy No date: Pre-diabetes No date: Primary osteoarthritis of left knee No date: Rheumatoid arthritis (HCC)   Reproductive/Obstetrics negative OB ROS                             Anesthesia Physical Anesthesia Plan  ASA: 2  Anesthesia Plan: General   Post-op Pain Management:    Induction: Intravenous  PONV Risk Score and Plan: 2 and Ondansetron, Dexamethasone, Treatment may vary due  to age or medical condition and Midazolam  Airway Management Planned: LMA  Additional Equipment:   Intra-op Plan:   Post-operative Plan: Extubation in OR  Informed Consent: I have reviewed the patients History and Physical, chart, labs and discussed the procedure including the risks, benefits and alternatives for the proposed anesthesia with the patient or authorized representative who has indicated his/her understanding and acceptance.     Dental Advisory Given  Plan Discussed with: Anesthesiologist, CRNA and Surgeon  Anesthesia Plan Comments:         Anesthesia Quick Evaluation

## 2023-01-09 NOTE — H&P (Signed)
Cc: Grade IV hemorrhoids [K64.0]  HPI: Pt returns today for Grade I hemorrhoids [K64.0]. Risks/benefits/alternatives discussed again and pt agrees to proceed with banding  ROS:  A 15 point review of systems was performed and was negative except as noted in HPI  Objective:    Ht 177.8 cm (5\' 10" )  Wt (!) 105.2 kg (231 lb 14.8 oz)  BMI 33.28 kg/m   Constitutional : alert, appears stated age, cooperative, and no distress  Musculoskeletal: Steady gait and movement  Psychiatric: Normal affect, non-agitated, not confused  Rectal: See below   Assessment:    Grade I hemorrhoids [K64.0] Plan:     1) Grade I hemorrhoids [K64.0] procedure noted as below.  Pre-Op Dx: Grade I hemorrhoids [K64.0] Post-Op Dx: same Anesthesia: none EBL: minimal Complications: none apparent  Procedure: internal hemorrhoidal banding, aborted  Description of Procedure:  Consent obtained, time out performed. Patient placed in left lateral position. Area sterilized and draped in usual position. DRE revealed enlarged internal hemorrhoids at RA portion, tender, large. Anoscope prepped and inserted gently and reconfirmed position of hemorrhoids. Due to sharp pain and size of hemorrhoid, changed from previous exam, procedure aborted since this was not amenable to banding. Discussed r/b/a to proceeding with open hemorrhoidectomy Alternatives include the options of observation, medical management. Benefits include symptomatic relief. I discussed in detail and the complications related to the operation and the anesthesia, including bleeding, infection, recurrence, remote possibility of temporary or permanent fecal incontinence, poor/delayed wound healing, chronic pain, and additional procedures to address said risks. The risks of general anesthetic, if used, includes MI, CVA, sudden death or even reaction to anesthetic medications also discussed.  We also discussed typical post operative recovery which includes weeks to  potentially months of anal pain, drainage, occasional bleeding, and sense of fecal urgency.   ED return precautions given for sudden increase in pain, bleeding, with possible accompanying fever, nausea, and/or vomiting.  The patient understands the risks, any and all questions were answered to the patient's satisfaction.  2. Patient has elected to proceed with surgical treatment. Procedure will be scheduled.

## 2023-01-09 NOTE — Op Note (Signed)
Preoperative diagnosis: fourth degree hemorrhoids.   Postoperative diagnosis: fourth  degree hemorrhoids.  Procedure: exam under anesthesia, two column hemorrhoidectomy.  Surgeon: Tonna Boehringer  Anesthesia: general  Specimen: hemorrhoids x2  Complications: none  EBL: 15mL  Wound classification: Clean Contaminated  Indications: Patient is a 62 y.o. male was found to have symptomatic hemorrhoids refractory to medical management.   Findings: 1. fourth  degree hemorrhoids 2. Internal and external anal sphincter palpated and preserved 3. Adequate hemostasis  Description of procedure: The patient was brought to the operating room and general anesthesia was induced. Patient was placed high lithotomy position. A time-out was completed verifying correct patient, procedure, site, positioning, and implant(s) and/or special equipment prior to beginning this procedure. The perineum was prepped and draped in standard sterile fashion. Local anesthetic was injected as a perianal block. An anoscope was introduced and hemorrhoidal pedicles identified.  A harmonic was placed across the base of the posterior pedicle and excess hemorrhoidal tissue removed.  Specimen was passed off operative field pending pathology.  3-0 Vicryl then used to close the open wound.  A harmonic was placed across the base of the anterior pedicle and excess hemorrhoidal tissue removed.  Specimen was passed off operative field pending pathology.  3-0 Vicryl then used to close the open wound.  Hemostasis achieved with additional 3-0 vicryl suture in areas of bleeding until all active bleeding controlled.  Last inspection of the anal canal did not note any additional hemorrhoidal tissue and no other pathology. Exparel injected as a perianal block. A gauze pad was tucked between the gluteal folds, and secured in place with mesh underwear.  The patient tolerated the procedure well and was taken to the postanesthesia care unit in stable  condition.  Sponge and instrument count correct at end of procedure.

## 2023-01-09 NOTE — Transfer of Care (Signed)
Immediate Anesthesia Transfer of Care Note  Patient: Francis Robinson  Procedure(s) Performed: HEMORRHOIDECTOMY (Buttocks)  Patient Location: PACU  Anesthesia Type:General  Level of Consciousness: awake  Airway & Oxygen Therapy: Patient Spontanous Breathing and Patient connected to face mask oxygen  Post-op Assessment: Report given to RN and Post -op Vital signs reviewed and stable  Post vital signs: Reviewed  Last Vitals:  Vitals Value Taken Time  BP 124/69 01/09/23 0853  Temp 41F   Pulse 61 01/09/23 0858  Resp 13 01/09/23 0858  SpO2 92 % 01/09/23 0858  Vitals shown include unfiled device data.  Last Pain:  Vitals:   01/09/23 0714  TempSrc: Temporal  PainSc: 5          Complications: No notable events documented.

## 2023-01-09 NOTE — Discharge Instructions (Addendum)
Hemorrhoids, Care After This sheet gives you information about how to care for yourself after your procedure. Your health care provider may also give you more specific instructions. If you have problems or questions, contact your health care provider. What can I expect after the procedure? After the procedure, it is common to have: Soreness. Bruising. Itching.  Follow these instructions at home: site care Follow instructions from your health care provider about how to take care of your site. Make sure you: LEAVE packing in place until it falls out on its own.  No need to replace afterwards Leave stitches (sutures), skin glue, or adhesive strips in place.  If the area bleeds or bruises, apply gentle pressure for 10 minutes. OK TO SHOWER IN 24HRS  General instructions Rest and then return to your normal activities as told by your health care provider. tylenol and advil as needed for discomfort.  Please alternate between the two every four hours as needed for pain.    Use narcotics, if prescribed, only when tylenol and motrin is not enough to control pain.  325-650mg  every 8hrs to max of 3000mg /24hrs (including the 325mg  in every norco dose) for the tylenol.    Advil up to 800mg  per dose every 8hrs as needed for pain.   Keep all follow-up visits as told by your health care provider. This is important. Contact a health care provider if you have excessive: redness, swelling, or pain around your site. blood coming from your site. pus or a bad smell coming from your site. You have a fever.  Get help right away if: You have bleeding that does not stop with pressure or a dressing. Summary After the procedure, it is common to have some soreness, bruising, and itching at the site. Follow instructions from your health care provider about how to take care of your site. Keep all follow-up visits as told by your health care provider. This is important. This information is not intended to replace  advice given to you by your health care provider. Make sure you discuss any questions you have with your health care provider. Document Released: 05/05/2015 Document Revised: 10/06/2017 Document Reviewed: 10/06/2017 Elsevier Interactive Patient Education  2019 [employer] AMBULATORY SURGERY  DISCHARGE INSTRUCTIONS   The drugs that you were given will stay in your system until tomorrow so for the next 24 hours you should not:  Drive an automobile Make any legal decisions Drink any alcoholic beverage   You may resume regular meals tomorrow.  Today it is better to start with liquids and gradually work up to solid foods.  You may eat anything you prefer, but it is better to start with liquids, then soup and crackers, and gradually work up to solid foods.   Please notify your doctor immediately if you have any unusual bleeding, trouble breathing, redness and pain at the surgery site, drainage, fever, or pain not relieved by medication.     Your post-operative visit with Dr.                                       is: Date:                        Time:    Please call to schedule your post-operative visit.  Additional Instructions:

## 2023-01-09 NOTE — Anesthesia Procedure Notes (Signed)
Procedure Name: LMA Insertion Date/Time: 01/09/2023 8:04 AM  Performed by: Merlene Pulling, CRNAPre-anesthesia Checklist: Patient identified, Patient being monitored, Timeout performed, Emergency Drugs available and Suction available Patient Re-evaluated:Patient Re-evaluated prior to induction Oxygen Delivery Method: Circle system utilized Preoxygenation: Pre-oxygenation with 100% oxygen Induction Type: IV induction Ventilation: Mask ventilation without difficulty LMA: LMA inserted LMA Size: 3.0 Tube type: Oral Number of attempts: 1 Placement Confirmation: positive ETCO2 and breath sounds checked- equal and bilateral Tube secured with: Tape Dental Injury: Teeth and Oropharynx as per pre-operative assessment

## 2023-01-09 NOTE — Interval H&P Note (Signed)
History and Physical Interval Note:  01/09/2023 7:26 AM  Francis Robinson  has presented today for surgery, with the diagnosis of grade I hemorrhoids K64.0.  The various methods of treatment have been discussed with the patient and family. After consideration of risks, benefits and other options for treatment, the patient has consented to  Procedure(s): HEMORRHOIDECTOMY (N/A) as a surgical intervention.  The patient's history has been reviewed, patient examined, no change in status, stable for surgery.  I have reviewed the patient's chart and labs.  Questions were answered to the patient's satisfaction.     Flor Houdeshell Tonna Boehringer

## 2023-01-10 ENCOUNTER — Encounter: Payer: Self-pay | Admitting: Surgery

## 2023-01-12 LAB — SURGICAL PATHOLOGY

## 2023-04-29 ENCOUNTER — Ambulatory Visit: Payer: Self-pay | Admitting: Surgery

## 2023-04-29 NOTE — H&P (View-Only) (Signed)
Subjective:    CC: Umbilical hernia without obstruction and without gangrene [K42.9]   HPI:   Subjective Francis Robinson is a 63 y.o. male who returns for evaluation of above. Asymptomatic but growing in size, will like repair.   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/2020), Bilateral high frequency sensorineural hearing loss (10/05/2018), Elbow injury, left, sequela (06/14/2014), Essential hypertension (11/25/2014), Heavy alcohol consumption (02/21/2015), Obesity (BMI 30.0-34.9) (03/07/2021), Obstructive sleep apnea syndrome (12/26/2021), Peripheral neuropathy (01/27/2013), Plantar fasciitis- left (01/27/2013), Prediabetes (03/07/2021), Primary osteoarthritis of both knees (05/01/2016), Pure hypercholesterolemia (01/27/2013), Rheumatoid arthritis(714.0) (CMS/HHS-HCC), Right knee pain, chronic (01/27/2013), S/P shoulder replacement, 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   Past Surgical History:  Past Surgical History       Past Surgical History:  Procedure Laterality Date   COLONOSCOPY N/A 05/05/2015    (03/27/2012) Dr. Christean Leaf @ [facility] - Adenomatous Polyps i.e. Sess Serr Adenoma(56mm)   ARTHROPLASTY TOTAL KNEE Right 07/01/2016    Procedure: ARTHROPLASTY, KNEE, CONDYLE AND PLATEAU; MEDIAL AND LATERAL COMPARTMENTSWITH OR WITHOUT PATELLA RESURFACING (TOTAL KNEE ARTHROPLASTY);  Surgeon: Currie Paris, MD;  Location: [facility] OR;  Service: Orthopedics;  Laterality: Right;   COLONOSCOPY W/BIOPSY N/A 05/08/2018    Procedure: COLONOSCOPY;  Surgeon: Dianne Dun, MD;  Location: [facility];  Service: Gastroenterology;  Laterality: N/A;   COLONOSCOPY N/A 05/08/2018    Dr. Clide Deutscher @ [facility] - Hyperplastic Polyps, PHP, FHP, 5 yr rpt per provider   REVISION TOTAL SHOULDER ARTHROPLASTY Left 11/01/2020    Procedure: Left ARTHROPLASTY, GLENOHUMERAL  JOINT; TOTAL SHOULDER (GLENOID AND PROXIMAL HUMERAL REPLACEMENT (EG, TOTAL SHOULDER);  Surgeon: Kennith Maes, MD;  Location: [facility];  Service: Orthopedics;  Laterality: Left;   TENODESIS BICEPS W/TRANSPLANTATION LONG TENDON OPEN Left 11/01/2020    Procedure: TENODESIS OF LONG TENDON OF BICEPS;  Surgeon: Kennith Maes, MD;  Location: [facility] OR;  Service: Orthopedics;  Laterality: Left;   ARTHROPLASTY TOTAL SHOULDER Right 05/31/2021    Procedure: RIGHT ARTHROPLASTY, GLENOHUMERAL JOINT; TOTAL SHOULDER (GLENOID AND PROXIMAL HUMERAL REPLACEMENT (EG, TOTAL SHOULDER);  Surgeon: Kennith Maes, MD;  Location: [facility];  Service: Orthopedics;  Laterality: Right;   TENODESIS BICEPS W/TRANSPLANTATION LONG TENDON OPEN Right 05/31/2021    Procedure: RIGHT TENODESIS OF LONG TENDON OF BICEPS;  Surgeon: Kennith Maes, MD;  Location: [facility];  Service: Orthopedics;  Laterality: Right;   ARTHROPLASTY TOTAL KNEE Left 01/15/2022    Procedure: LEFT ARTHROPLASTY, KNEE, CONDYLE AND PLATEAU; MEDIAL AND LATERAL COMPARTMENTSWITH OR WITHOUT PATELLA RESURFACING (TOTAL KNEE ARTHROPLASTY);  Surgeon: Currie Paris, MD;  Location: [facility];  Service: Orthopedics;  Laterality: Left;   hemorrhoidectomy/EUA   01/09/2023    Dr. Sung Amabile   cyst in left hand       cyst removal right back       FRACTURE SURGERY       JOINT REPLACEMENT   07/01/2016    Right knee   KNEE ARTHROSCOPY        right knee   left forearm repair 1982ish, from trauma       right hand /finger surgery       right heel-trauma as a child       TONSILLECTOMY       VASECTOMY       VASECTOMY            Family  History: family history includes Brain hemorrhage in his mother; Other in his father.   Social History:  reports that he quit smoking about 30 years ago. His smoking use included cigarettes. He has quit using smokeless tobacco.  His smokeless tobacco use included snuff. He reports  current alcohol use of about 4.0 standard drinks of alcohol per week. He reports that he does not currently use drugs after having used the following drugs: Marijuana.   Current Medications: has a current medication list which includes the following prescription(s): carvedilol, ezetimibe, lisinopril-hydrochlorothiazide, spironolactone, and triamcinolone.   Allergies:       Allergies as of 04/29/2023 - Reviewed 04/29/2023  Allergen Reaction Noted   Amlodipine Muscle Pain 01/20/2015   Methotrexate Rash 01/27/2012   Neurontin [gabapentin] Other (See Comments) 07/03/2016      ROS:  A 15 point review of systems was performed and pertinent positives and negatives noted in HPI   Objective:      Objective BP 139/76   Pulse 62   Ht 177.8 cm (5\' 10" )   Wt (!) 104.8 kg (231 lb 0.7 oz)   BMI 33.15 kg/m    Constitutional :  Alert, cooperative, no distress  Lymphatics/Throat:  Supple, no lymphadenopathy  Respiratory:  clear to auscultation bilaterally  Cardiovascular:  regular rate and rhythm  Gastrointestinal: soft, non-tender; bowel sounds normal; no masses,  no organomegaly. umbilical hernia noted.  moderate, reducible, and no overlying skin changes  Musculoskeletal: Steady gait and movement  Skin: Cool and moist  Psychiatric: Normal affect, non-agitated, not confused         LABS:  N/a    RADS: N/a Assessment:      Assessment Umbilical hernia without obstruction and without gangrene [K42.9]   Plan:      Plan 1. Umbilical hernia without obstruction and without gangrene [K42.9]   Discussed the risk of surgery including recurrence, which can be up to 50% in the case of incisional or complex hernias, possible use of prosthetic materials (mesh) and the increased risk of mesh infxn if used, bleeding, chronic pain, post-op infxn, post-op SBO or ileus, and possible re-operation to address said risks. The risks of general anesthetic, if used, includes MI, CVA, sudden death or even  reaction to anesthetic medications also discussed. Alternatives include continued observation.  Benefits include possible symptom relief, prevention of incarceration, strangulation, enlargement in size over time, and the risk of emergency surgery in the face of strangulation.    Typical post-op recovery time of 3-5 days with 2 weeks of activity restrictions were also discussed.   ED return precautions given for sudden increase in pain, size of hernia with accompanying fever, nausea, and/or vomiting.   The patient verbalized understanding and all questions were answered to the patient's satisfaction.   Patient has elected to proceed with surgical treatment. Procedure will be scheduled. robotic assisted laparoscopic. Will proceed after screening colonoscopy   2. Screening cscope. R/b/a discussed for colonoscopy  Risks include bleeding, perforation.  Benefits include diagnostic, curative procedure if needed.  Alternatives include continued observation.  Pt verbalized understanding.     labs/images/medications/previous chart entries reviewed personally and relevant changes/updates noted above.

## 2023-04-29 NOTE — H&P (Signed)
 Subjective:    CC: Umbilical hernia without obstruction and without gangrene [K42.9]   HPI:   Subjective Francis Robinson is a 64 y.o. male who returns for evaluation of above. Asymptomatic but growing in size, will like repair.   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/2020), Bilateral high frequency sensorineural hearing loss (10/05/2018), Elbow injury, left, sequela (06/14/2014), Essential hypertension (11/25/2014), Heavy alcohol consumption (02/21/2015), Obesity (BMI 30.0-34.9) (03/07/2021), Obstructive sleep apnea syndrome (12/26/2021), Peripheral neuropathy (01/27/2013), Plantar fasciitis- left (01/27/2013), Prediabetes (03/07/2021), Primary osteoarthritis of both knees (05/01/2016), Pure hypercholesterolemia (01/27/2013), Rheumatoid arthritis(714.0) (CMS/HHS-HCC), Right knee pain, chronic (01/27/2013), S/P shoulder replacement, 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   Past Surgical History:  Past Surgical History       Past Surgical History:  Procedure Laterality Date   COLONOSCOPY N/A 05/05/2015    (03/27/2012) Dr. Christean Leaf @ [facility] - Adenomatous Polyps i.e. Sess Serr Adenoma(56mm)   ARTHROPLASTY TOTAL KNEE Right 07/01/2016    Procedure: ARTHROPLASTY, KNEE, CONDYLE AND PLATEAU; MEDIAL AND LATERAL COMPARTMENTSWITH OR WITHOUT PATELLA RESURFACING (TOTAL KNEE ARTHROPLASTY);  Surgeon: Currie Paris, MD;  Location: [facility] OR;  Service: Orthopedics;  Laterality: Right;   COLONOSCOPY W/BIOPSY N/A 05/08/2018    Procedure: COLONOSCOPY;  Surgeon: Dianne Dun, MD;  Location: [facility];  Service: Gastroenterology;  Laterality: N/A;   COLONOSCOPY N/A 05/08/2018    Dr. Clide Deutscher @ [facility] - Hyperplastic Polyps, PHP, FHP, 5 yr rpt per provider   REVISION TOTAL SHOULDER ARTHROPLASTY Left 11/01/2020    Procedure: Left ARTHROPLASTY, GLENOHUMERAL  JOINT; TOTAL SHOULDER (GLENOID AND PROXIMAL HUMERAL REPLACEMENT (EG, TOTAL SHOULDER);  Surgeon: Kennith Maes, MD;  Location: [facility];  Service: Orthopedics;  Laterality: Left;   TENODESIS BICEPS W/TRANSPLANTATION LONG TENDON OPEN Left 11/01/2020    Procedure: TENODESIS OF LONG TENDON OF BICEPS;  Surgeon: Kennith Maes, MD;  Location: [facility] OR;  Service: Orthopedics;  Laterality: Left;   ARTHROPLASTY TOTAL SHOULDER Right 05/31/2021    Procedure: RIGHT ARTHROPLASTY, GLENOHUMERAL JOINT; TOTAL SHOULDER (GLENOID AND PROXIMAL HUMERAL REPLACEMENT (EG, TOTAL SHOULDER);  Surgeon: Kennith Maes, MD;  Location: [facility];  Service: Orthopedics;  Laterality: Right;   TENODESIS BICEPS W/TRANSPLANTATION LONG TENDON OPEN Right 05/31/2021    Procedure: RIGHT TENODESIS OF LONG TENDON OF BICEPS;  Surgeon: Kennith Maes, MD;  Location: [facility];  Service: Orthopedics;  Laterality: Right;   ARTHROPLASTY TOTAL KNEE Left 01/15/2022    Procedure: LEFT ARTHROPLASTY, KNEE, CONDYLE AND PLATEAU; MEDIAL AND LATERAL COMPARTMENTSWITH OR WITHOUT PATELLA RESURFACING (TOTAL KNEE ARTHROPLASTY);  Surgeon: Currie Paris, MD;  Location: [facility];  Service: Orthopedics;  Laterality: Left;   hemorrhoidectomy/EUA   01/09/2023    Dr. Sung Amabile   cyst in left hand       cyst removal right back       FRACTURE SURGERY       JOINT REPLACEMENT   07/01/2016    Right knee   KNEE ARTHROSCOPY        right knee   left forearm repair 1982ish, from trauma       right hand /finger surgery       right heel-trauma as a child       TONSILLECTOMY       VASECTOMY       VASECTOMY            Family  History: family history includes Brain hemorrhage in his mother; Other in his father.   Social History:  reports that he quit smoking about 30 years ago. His smoking use included cigarettes. He has quit using smokeless tobacco.  His smokeless tobacco use included snuff. He reports  current alcohol use of about 4.0 standard drinks of alcohol per week. He reports that he does not currently use drugs after having used the following drugs: Marijuana.   Current Medications: has a current medication list which includes the following prescription(s): carvedilol, ezetimibe, lisinopril-hydrochlorothiazide, spironolactone, and triamcinolone.   Allergies:       Allergies as of 04/29/2023 - Reviewed 04/29/2023  Allergen Reaction Noted   Amlodipine Muscle Pain 01/20/2015   Methotrexate Rash 01/27/2012   Neurontin [gabapentin] Other (See Comments) 07/03/2016      ROS:  A 15 point review of systems was performed and pertinent positives and negatives noted in HPI   Objective:      Objective BP 139/76   Pulse 62   Ht 177.8 cm (5\' 10" )   Wt (!) 104.8 kg (231 lb 0.7 oz)   BMI 33.15 kg/m    Constitutional :  Alert, cooperative, no distress  Lymphatics/Throat:  Supple, no lymphadenopathy  Respiratory:  clear to auscultation bilaterally  Cardiovascular:  regular rate and rhythm  Gastrointestinal: soft, non-tender; bowel sounds normal; no masses,  no organomegaly. umbilical hernia noted.  moderate, reducible, and no overlying skin changes  Musculoskeletal: Steady gait and movement  Skin: Cool and moist  Psychiatric: Normal affect, non-agitated, not confused         LABS:  N/a    RADS: N/a Assessment:      Assessment Umbilical hernia without obstruction and without gangrene [K42.9]   Plan:      Plan 1. Umbilical hernia without obstruction and without gangrene [K42.9]   Discussed the risk of surgery including recurrence, which can be up to 50% in the case of incisional or complex hernias, possible use of prosthetic materials (mesh) and the increased risk of mesh infxn if used, bleeding, chronic pain, post-op infxn, post-op SBO or ileus, and possible re-operation to address said risks. The risks of general anesthetic, if used, includes MI, CVA, sudden death or even  reaction to anesthetic medications also discussed. Alternatives include continued observation.  Benefits include possible symptom relief, prevention of incarceration, strangulation, enlargement in size over time, and the risk of emergency surgery in the face of strangulation.    Typical post-op recovery time of 3-5 days with 2 weeks of activity restrictions were also discussed.   ED return precautions given for sudden increase in pain, size of hernia with accompanying fever, nausea, and/or vomiting.   The patient verbalized understanding and all questions were answered to the patient's satisfaction.   Patient has elected to proceed with surgical treatment. Procedure will be scheduled. robotic assisted laparoscopic. Will proceed after screening colonoscopy   2. Screening cscope. R/b/a discussed for colonoscopy  Risks include bleeding, perforation.  Benefits include diagnostic, curative procedure if needed.  Alternatives include continued observation.  Pt verbalized understanding.     labs/images/medications/previous chart entries reviewed personally and relevant changes/updates noted above.

## 2023-05-09 ENCOUNTER — Other Ambulatory Visit: Payer: Self-pay

## 2023-05-09 ENCOUNTER — Encounter
Admission: RE | Admit: 2023-05-09 | Discharge: 2023-05-09 | Disposition: A | Discharge status: Home / Self Care | Payer: Self-pay | Source: Ambulatory Visit | Attending: Surgery | Admitting: Surgery

## 2023-05-09 VITALS — Ht 70.0 in | Wt 230.0 lb

## 2023-05-09 DIAGNOSIS — G4739 Other sleep apnea: Secondary | ICD-10-CM

## 2023-05-09 DIAGNOSIS — Z7189 Other specified counseling: Secondary | ICD-10-CM

## 2023-05-09 DIAGNOSIS — I10 Essential (primary) hypertension: Secondary | ICD-10-CM

## 2023-05-09 HISTORY — DX: Unspecified osteoarthritis, unspecified site: M19.90

## 2023-05-09 HISTORY — DX: Alcohol use, unspecified, uncomplicated: F10.90

## 2023-05-09 HISTORY — DX: Plantar fascial fibromatosis: M72.2

## 2023-05-09 HISTORY — DX: Umbilical hernia without obstruction or gangrene: K42.9

## 2023-05-09 HISTORY — DX: Sensorineural hearing loss, bilateral: H90.3

## 2023-05-09 HISTORY — DX: Obstructive sleep apnea (adult) (pediatric): G47.33

## 2023-05-09 NOTE — Patient Instructions (Addendum)
Your procedure is scheduled on: Friday 05/16/23  Report to the Registration Desk on the 1st floor of the [facility]. To find out your arrival time, please call [phone] between 1PM - 3PM on: Thursday 05/15/23 If your arrival time is 6:00 am, do not arrive before that time as the [facility] entrance doors do not open until 6:00 am.  REMEMBER: Instructions that are not followed completely may result in serious medical risk, up to and including death; or upon the discretion of your surgeon and anesthesiologist your surgery may need to be rescheduled.  Do not eat food after midnight the night before surgery.  No gum chewing or hard candies.  You may however, drink CLEAR liquids up to 2 hours before you are scheduled to arrive for your surgery. Do not drink anything within 2 hours of your scheduled arrival time.  Clear liquids include: - water  - apple juice without pulp - gatorade (not RED colors) - black coffee or tea (Do NOT add milk or creamers to the coffee or tea) Do NOT drink anything that is not on this list.   One week prior to surgery: Stop Anti-inflammatories (NSAIDS) such as Advil, Aleve, Ibuprofen, Motrin, Naproxen, Naprosyn and Aspirin based products such as Excedrin, Goody's Powder, BC Powder. Stop ANY OVER THE COUNTER supplements until after surgery.  You may however, continue to take Tylenol if needed for pain up until the day of surgery.   Continue taking all of your other prescription medications up until the day of surgery.  ON THE DAY OF SURGERY ONLY TAKE THESE MEDICATIONS WITH SIPS OF WATER:  carvedilol (COREG) 6.25    No Alcohol for 24 hours before or after surgery.  No Smoking including e-cigarettes for 24 hours before surgery.  No chewable tobacco products for at least 6 hours before surgery.  No nicotine patches on the day of surgery.  Do not use any "recreational" drugs for at least a week (preferably 2 weeks) before your surgery.  Please be  advised that the combination of cocaine and anesthesia may have negative outcomes, up to and including death. If you test positive for cocaine, your surgery will be cancelled.  On the morning of surgery brush your teeth with toothpaste and water, you may rinse your mouth with mouthwash if you wish. Do not swallow any toothpaste or mouthwash.  Use CHG Soap or wipes as directed on instruction sheet.  Do not wear jewelry, make-up, hairpins, clips or nail polish.  For welded (permanent) jewelry: bracelets, anklets, waist bands, etc.  Please have this removed prior to surgery.  If it is not removed, there is a chance that hospital personnel will need to cut it off on the day of surgery.  Do not wear lotions, powders, or perfumes.   Do not shave body hair from the neck down 48 hours before surgery.  Contact lenses, hearing aids and dentures may not be worn into surgery.  Do not bring valuables to the hospital. [facility] is not responsible for any missing/lost belongings or valuables.   Notify your doctor if there is any change in your medical condition (cold, fever, infection).  Wear comfortable clothing (specific to your surgery type) to the hospital.  After surgery, you can help prevent lung complications by doing breathing exercises.  Take deep breaths and cough every 1-2 hours. Your doctor may order a device called an Incentive Spirometer to help you take deep breaths. When coughing or sneezing, hold a pillow firmly against your  incision with both hands. This is called "splinting." Doing this helps protect your incision. It also decreases belly discomfort.  If you are being admitted to the hospital overnight, leave your suitcase in the car. After surgery it may be brought to your room.  In case of increased patient census, it may be necessary for you, the patient, to continue your postoperative care in the Same Day Surgery department.  If you are being discharged the day of surgery,  you will not be allowed to drive home. You will need a responsible individual to drive you home and stay with you for 24 hours after surgery.   If you are taking public transportation, you will need to have a responsible individual with you.  Please call the Pre-admissions Testing Dept. at [phone] if you have any questions about these instructions.  Surgery Visitation Policy:  Patients having surgery or a procedure may have two visitors.  Children under the age of 25 must have an adult with them who is not the patient.  Temporary Visitor Restrictions Due to increasing cases of flu, RSV and COVID-19: Children ages 85 and under will not be able to visit patients in [facility] hospitals under most circumstances.  Inpatient Visitation:    Visiting hours are 7 a.m. to 8 p.m. Up to four visitors are allowed at one time in a patient room. The visitors may rotate out with other people during the day.  One visitor age 51 or older may stay with the patient overnight and must be in the room by 8 p.m.    Preparing for Surgery with CHLORHEXIDINE GLUCONATE (CHG) Soap  Chlorhexidine Gluconate (CHG) Soap  o An antiseptic cleaner that kills germs and bonds with the skin to continue killing germs even after washing  o Used for showering the night before surgery and morning of surgery  Before surgery, you can play an important role by reducing the number of germs on your skin.  CHG (Chlorhexidine gluconate) soap is an antiseptic cleanser which kills germs and bonds with the skin to continue killing germs even after washing.  Please do not use if you have an allergy to CHG or antibacterial soaps. If your skin becomes reddened/irritated stop using the CHG.  1. Shower the NIGHT BEFORE SURGERY and the MORNING OF SURGERY with CHG soap.  2. If you choose to wash your hair, wash your hair first as usual with your normal shampoo.  3. After shampooing, rinse your hair and body thoroughly to  remove the shampoo.  4. Use CHG as you would any other liquid soap. You can apply CHG directly to the skin and wash gently with a scrungie or a clean washcloth.  5. Apply the CHG soap to your body only from the neck down. Do not use on open wounds or open sores. Avoid contact with your eyes, ears, mouth, and genitals (private parts). Wash face and genitals (private parts) with your normal soap.  6. Wash thoroughly, paying special attention to the area where your surgery will be performed.  7. Thoroughly rinse your body with warm water.  8. Do not shower/wash with your normal soap after using and rinsing off the CHG soap.  9. Pat yourself dry with a clean towel.  10. Wear clean pajamas to bed the night before surgery.  12. Place clean sheets on your bed the night of your first shower and do not sleep with pets.  13. Shower again with the CHG soap on the day of  surgery prior to arriving at the hospital.  14. Do not apply any deodorants/lotions/powders.  15. Please wear clean clothes to the hospital.

## 2023-05-12 ENCOUNTER — Encounter
Admission: RE | Admit: 2023-05-12 | Discharge: 2023-05-12 | Disposition: A | Discharge status: Home / Self Care | Payer: Self-pay | Source: Ambulatory Visit | Attending: Surgery | Admitting: Surgery

## 2023-05-12 DIAGNOSIS — Z01812 Encounter for preprocedural laboratory examination: Secondary | ICD-10-CM | POA: Diagnosis present

## 2023-05-12 DIAGNOSIS — I10 Essential (primary) hypertension: Secondary | ICD-10-CM | POA: Insufficient documentation

## 2023-05-12 DIAGNOSIS — Z01810 Encounter for preprocedural cardiovascular examination: Secondary | ICD-10-CM | POA: Diagnosis present

## 2023-05-12 DIAGNOSIS — Z01818 Encounter for other preprocedural examination: Secondary | ICD-10-CM | POA: Diagnosis not present

## 2023-05-12 DIAGNOSIS — R9431 Abnormal electrocardiogram [ECG] [EKG]: Secondary | ICD-10-CM | POA: Diagnosis not present

## 2023-05-12 LAB — CBC
HCT: 42.8 % (ref 39.0–52.0)
Hemoglobin: 15 g/dL (ref 13.0–17.0)
MCH: 31.4 pg (ref 26.0–34.0)
MCHC: 35 g/dL (ref 30.0–36.0)
MCV: 89.5 fL (ref 80.0–100.0)
Platelets: 225 10*3/uL (ref 150–400)
RBC: 4.78 MIL/uL (ref 4.22–5.81)
RDW: 12.3 % (ref 11.5–15.5)
WBC: 7.1 10*3/uL (ref 4.0–10.5)
nRBC: 0 % (ref 0.0–0.2)

## 2023-05-16 ENCOUNTER — Ambulatory Visit
Admission: RE | Admit: 2023-05-16 | Discharge: 2023-05-16 | Disposition: A | Discharge status: Home / Self Care | Payer: 59 | Attending: Surgery | Admitting: Surgery

## 2023-05-16 ENCOUNTER — Encounter
Admission: RE | Disposition: A | Discharge status: Home / Self Care | Payer: Self-pay | Source: Home / Self Care | Attending: Surgery

## 2023-05-16 ENCOUNTER — Encounter: Payer: Self-pay | Admitting: Surgery

## 2023-05-16 ENCOUNTER — Ambulatory Visit: Payer: 59 | Admitting: Urgent Care

## 2023-05-16 ENCOUNTER — Other Ambulatory Visit: Payer: Self-pay

## 2023-05-16 ENCOUNTER — Ambulatory Visit: Payer: 59 | Admitting: Anesthesiology

## 2023-05-16 DIAGNOSIS — E7800 Pure hypercholesterolemia, unspecified: Secondary | ICD-10-CM | POA: Insufficient documentation

## 2023-05-16 DIAGNOSIS — K429 Umbilical hernia without obstruction or gangrene: Secondary | ICD-10-CM | POA: Insufficient documentation

## 2023-05-16 DIAGNOSIS — Z87891 Personal history of nicotine dependence: Secondary | ICD-10-CM | POA: Diagnosis not present

## 2023-05-16 DIAGNOSIS — G4733 Obstructive sleep apnea (adult) (pediatric): Secondary | ICD-10-CM | POA: Diagnosis not present

## 2023-05-16 DIAGNOSIS — E669 Obesity, unspecified: Secondary | ICD-10-CM | POA: Insufficient documentation

## 2023-05-16 DIAGNOSIS — Z6833 Body mass index (BMI) 33.0-33.9, adult: Secondary | ICD-10-CM | POA: Insufficient documentation

## 2023-05-16 DIAGNOSIS — I10 Essential (primary) hypertension: Secondary | ICD-10-CM | POA: Diagnosis not present

## 2023-05-16 DIAGNOSIS — M069 Rheumatoid arthritis, unspecified: Secondary | ICD-10-CM | POA: Diagnosis not present

## 2023-05-16 DIAGNOSIS — I2510 Atherosclerotic heart disease of native coronary artery without angina pectoris: Secondary | ICD-10-CM | POA: Insufficient documentation

## 2023-05-16 SURGERY — REPAIR, HERNIA, UMBILICAL, ROBOT-ASSISTED
Anesthesia: General | Site: Abdomen

## 2023-05-16 MED ORDER — OXYCODONE HCL 5 MG PO TABS
ORAL_TABLET | ORAL | Status: AC
  Filled 2023-05-16: qty 1

## 2023-05-16 MED ORDER — CHLORHEXIDINE GLUCONATE CLOTH 2 % EX PADS: 6.0000 | MEDICATED_PAD | Freq: Once | CUTANEOUS | Status: DC

## 2023-05-16 MED ORDER — OXYCODONE HCL 5 MG PO TABS
5.0000 mg | ORAL_TABLET | Freq: Once | ORAL | Status: AC
  Administered 2023-05-16: 5 mg via ORAL

## 2023-05-16 MED ORDER — IBUPROFEN 800 MG PO TABS: 800.0000 mg | ORAL_TABLET | Freq: Three times a day (TID) | ORAL | 0 refills | Status: AC | PRN

## 2023-05-16 MED ORDER — CHLORHEXIDINE GLUCONATE 0.12 % MT SOLN
15.0000 mL | Freq: Once | OROMUCOSAL | Status: AC
  Administered 2023-05-16: 15 mL via OROMUCOSAL

## 2023-05-16 MED ORDER — FENTANYL CITRATE (PF) 100 MCG/2ML IJ SOLN
INTRAMUSCULAR | Status: DC | PRN
  Administered 2023-05-16: 25 ug via INTRAVENOUS
  Administered 2023-05-16: 50 ug via INTRAVENOUS
  Administered 2023-05-16: 25 ug via INTRAVENOUS

## 2023-05-16 MED ORDER — KETOROLAC TROMETHAMINE 30 MG/ML IJ SOLN
INTRAMUSCULAR | Status: AC
  Filled 2023-05-16: qty 1

## 2023-05-16 MED ORDER — BUPIVACAINE HCL (PF) 0.5 % IJ SOLN
INTRAMUSCULAR | Status: AC
  Filled 2023-05-16: qty 30

## 2023-05-16 MED ORDER — CEFAZOLIN SODIUM-DEXTROSE 2-4 GM/100ML-% IV SOLN
2.0000 g | INTRAVENOUS | Status: AC
  Administered 2023-05-16: 2 g via INTRAVENOUS

## 2023-05-16 MED ORDER — DROPERIDOL 2.5 MG/ML IJ SOLN: 0.6250 mg | Freq: Once | INTRAMUSCULAR | Status: DC | PRN

## 2023-05-16 MED ORDER — SUGAMMADEX SODIUM 200 MG/2ML IV SOLN
INTRAVENOUS | Status: DC | PRN
  Administered 2023-05-16: 200 mg via INTRAVENOUS

## 2023-05-16 MED ORDER — EPINEPHRINE PF 1 MG/ML IJ SOLN
INTRAMUSCULAR | Status: AC
  Filled 2023-05-16: qty 1

## 2023-05-16 MED ORDER — BUPIVACAINE-EPINEPHRINE (PF) 0.5% -1:200000 IJ SOLN
INTRAMUSCULAR | Status: AC
  Filled 2023-05-16: qty 30

## 2023-05-16 MED ORDER — ORAL CARE MOUTH RINSE: 15.0000 mL | Freq: Once | OROMUCOSAL | Status: AC

## 2023-05-16 MED ORDER — MIDAZOLAM HCL 2 MG/2ML IJ SOLN
INTRAMUSCULAR | Status: DC | PRN
  Administered 2023-05-16: 2 mg via INTRAVENOUS

## 2023-05-16 MED ORDER — FENTANYL CITRATE (PF) 100 MCG/2ML IJ SOLN
INTRAMUSCULAR | Status: AC
  Filled 2023-05-16: qty 2

## 2023-05-16 MED ORDER — CELECOXIB 200 MG PO CAPS
200.0000 mg | ORAL_CAPSULE | ORAL | Status: AC
  Administered 2023-05-16: 200 mg via ORAL

## 2023-05-16 MED ORDER — MIDAZOLAM HCL 2 MG/2ML IJ SOLN
INTRAMUSCULAR | Status: AC
  Filled 2023-05-16: qty 2

## 2023-05-16 MED ORDER — SODIUM CHLORIDE FLUSH 0.9 % IV SOLN
INTRAVENOUS | Status: AC
  Filled 2023-05-16: qty 30

## 2023-05-16 MED ORDER — GLYCOPYRROLATE 0.2 MG/ML IJ SOLN
INTRAMUSCULAR | Status: AC
  Filled 2023-05-16: qty 1

## 2023-05-16 MED ORDER — HYDROCODONE-ACETAMINOPHEN 5-325 MG PO TABS: 1.0000 | ORAL_TABLET | Freq: Four times a day (QID) | ORAL | 0 refills | Status: AC | PRN

## 2023-05-16 MED ORDER — PROPOFOL 10 MG/ML IV BOLUS
INTRAVENOUS | Status: AC
  Filled 2023-05-16: qty 20

## 2023-05-16 MED ORDER — PROPOFOL 10 MG/ML IV BOLUS
INTRAVENOUS | Status: DC | PRN
  Administered 2023-05-16: 150 mg via INTRAVENOUS

## 2023-05-16 MED ORDER — GLYCOPYRROLATE 0.2 MG/ML IJ SOLN
INTRAMUSCULAR | Status: DC | PRN
  Administered 2023-05-16: .2 mg via INTRAVENOUS

## 2023-05-16 MED ORDER — DOCUSATE SODIUM 100 MG PO CAPS: 100.0000 mg | ORAL_CAPSULE | Freq: Two times a day (BID) | ORAL | 0 refills | Status: AC | PRN

## 2023-05-16 MED ORDER — 0.9 % SODIUM CHLORIDE (POUR BTL) OPTIME
TOPICAL | Status: DC | PRN
  Administered 2023-05-16: 500 mL

## 2023-05-16 MED ORDER — FENTANYL CITRATE (PF) 100 MCG/2ML IJ SOLN
25.0000 ug | INTRAMUSCULAR | Status: DC | PRN
  Administered 2023-05-16 (×4): 50 ug via INTRAVENOUS

## 2023-05-16 MED ORDER — ACETAMINOPHEN 500 MG PO TABS
ORAL_TABLET | ORAL | Status: AC
  Filled 2023-05-16: qty 2

## 2023-05-16 MED ORDER — LACTATED RINGERS IV SOLN
INTRAVENOUS | Status: DC
Start: 2023-05-16 — End: 2023-05-16

## 2023-05-16 MED ORDER — ACETAMINOPHEN 500 MG PO TABS
1000.0000 mg | ORAL_TABLET | ORAL | Status: AC
  Administered 2023-05-16: 1000 mg via ORAL

## 2023-05-16 MED ORDER — KETOROLAC TROMETHAMINE 30 MG/ML IJ SOLN
INTRAMUSCULAR | Status: DC | PRN
  Administered 2023-05-16: 30 mg via INTRAVENOUS

## 2023-05-16 MED ORDER — SODIUM CHLORIDE (PF) 0.9 % IJ SOLN
INTRAMUSCULAR | Status: DC | PRN
  Administered 2023-05-16: 80 mL via SURGICAL_CAVITY

## 2023-05-16 MED ORDER — CHLORHEXIDINE GLUCONATE 0.12 % MT SOLN
OROMUCOSAL | Status: AC
  Filled 2023-05-16: qty 15

## 2023-05-16 MED ORDER — DEXAMETHASONE SODIUM PHOSPHATE 10 MG/ML IJ SOLN
INTRAMUSCULAR | Status: DC | PRN
  Administered 2023-05-16: 8 mg via INTRAVENOUS

## 2023-05-16 MED ORDER — LIDOCAINE HCL (CARDIAC) PF 100 MG/5ML IV SOSY
PREFILLED_SYRINGE | INTRAVENOUS | Status: DC | PRN
  Administered 2023-05-16: 100 mg via INTRAVENOUS

## 2023-05-16 MED ORDER — BUPIVACAINE LIPOSOME 1.3 % IJ SUSP
INTRAMUSCULAR | Status: AC
  Filled 2023-05-16: qty 20

## 2023-05-16 MED ORDER — ROCURONIUM BROMIDE 100 MG/10ML IV SOLN
INTRAVENOUS | Status: DC | PRN
  Administered 2023-05-16: 10 mg via INTRAVENOUS
  Administered 2023-05-16: 50 mg via INTRAVENOUS
  Administered 2023-05-16: 20 mg via INTRAVENOUS

## 2023-05-16 MED ORDER — ONDANSETRON HCL 4 MG/2ML IJ SOLN
INTRAMUSCULAR | Status: DC | PRN
  Administered 2023-05-16: 4 mg via INTRAVENOUS

## 2023-05-16 MED ORDER — DEXAMETHASONE SODIUM PHOSPHATE 10 MG/ML IJ SOLN
INTRAMUSCULAR | Status: AC
  Filled 2023-05-16: qty 1

## 2023-05-16 MED ORDER — ACETAMINOPHEN 325 MG PO TABS: 650.0000 mg | ORAL_TABLET | Freq: Three times a day (TID) | ORAL | 0 refills | Status: AC | PRN

## 2023-05-16 MED ORDER — ROCURONIUM BROMIDE 10 MG/ML (PF) SYRINGE
PREFILLED_SYRINGE | INTRAVENOUS | Status: AC
  Filled 2023-05-16: qty 10

## 2023-05-16 MED ORDER — LIDOCAINE HCL (PF) 2 % IJ SOLN
INTRAMUSCULAR | Status: AC
  Filled 2023-05-16: qty 5

## 2023-05-16 MED ORDER — CEFAZOLIN SODIUM-DEXTROSE 2-4 GM/100ML-% IV SOLN
INTRAVENOUS | Status: AC
  Filled 2023-05-16: qty 100

## 2023-05-16 MED ORDER — CELECOXIB 200 MG PO CAPS
ORAL_CAPSULE | ORAL | Status: AC
  Filled 2023-05-16: qty 1

## 2023-05-16 MED ORDER — ONDANSETRON HCL 4 MG/2ML IJ SOLN
INTRAMUSCULAR | Status: AC
  Filled 2023-05-16: qty 2

## 2023-05-16 SURGICAL SUPPLY — 43 items
COVER TIP SHEARS 8 DVNC (MISCELLANEOUS) ×1 IMPLANT
COVER WAND RF STERILE (DRAPES) ×1 IMPLANT
DERMABOND ADVANCED .7 DNX12 (GAUZE/BANDAGES/DRESSINGS) ×1 IMPLANT
DRAPE ARM DVNC X/XI (DISPOSABLE) ×3 IMPLANT
DRAPE COLUMN DVNC XI (DISPOSABLE) ×1 IMPLANT
ELECT REM PT RETURN 9FT ADLT (ELECTROSURGICAL) ×1
ELECTRODE REM PT RTRN 9FT ADLT (ELECTROSURGICAL) ×1 IMPLANT
FORCEPS BPLR FENES DVNC XI (FORCEP) ×1 IMPLANT
GLOVE BIOGEL PI IND STRL 7.0 (GLOVE) ×2 IMPLANT
GLOVE SURG SYN 6.5 ES PF (GLOVE) ×2
GLOVE SURG SYN 6.5 PF PI (GLOVE) ×2 IMPLANT
GOWN STRL REUS W/ TWL LRG LVL3 (GOWN DISPOSABLE) ×3 IMPLANT
GRASPER SUT TROCAR 14GX15 (MISCELLANEOUS) IMPLANT
IRRIGATOR SUCT 8 DISP DVNC XI (IRRIGATION / IRRIGATOR) IMPLANT
IV NS 1000ML BAXH (IV SOLUTION) IMPLANT
LABEL OR SOLS (LABEL) ×1 IMPLANT
MANIFOLD NEPTUNE II (INSTRUMENTS) ×1 IMPLANT
MESH PROGRIP HERNIA FLAT 15X15 (Mesh General) IMPLANT
NDL DRIVE SUT CUT DVNC (INSTRUMENTS) ×1 IMPLANT
NDL HYPO 22X1.5 SAFETY MO (MISCELLANEOUS) ×1 IMPLANT
NDL INSUFFLATION 14GA 120MM (NEEDLE) ×1 IMPLANT
NEEDLE DRIVE SUT CUT DVNC (INSTRUMENTS) ×1
NEEDLE HYPO 22X1.5 SAFETY MO (MISCELLANEOUS) ×1
NEEDLE INSUFFLATION 14GA 120MM (NEEDLE) ×1
OBTURATOR OPTICAL STND 8 DVNC (TROCAR) ×1
OBTURATOR OPTICALSTD 8 DVNC (TROCAR) ×1 IMPLANT
PACK LAP CHOLECYSTECTOMY (MISCELLANEOUS) ×1 IMPLANT
SCISSORS MNPLR CVD DVNC XI (INSTRUMENTS) ×1 IMPLANT
SEAL UNIV 5-12 XI (MISCELLANEOUS) ×3 IMPLANT
SET TUBE SMOKE EVAC HIGH FLOW (TUBING) ×1 IMPLANT
SOL ELECTROSURG ANTI STICK (MISCELLANEOUS) ×1
SOLUTION ELECTROSURG ANTI STCK (MISCELLANEOUS) ×1 IMPLANT
SUT MNCRL AB 4-0 PS2 18 (SUTURE) ×1 IMPLANT
SUT STRATA 3-0 30 PS-1 (SUTURE) IMPLANT
SUT STRATAFIX 0 PDS+ CT-2 23 (SUTURE) ×1
SUT VIC AB 3-0 SH 27X BRD (SUTURE) IMPLANT
SUT VICRYL 0 UR6 27IN ABS (SUTURE) ×1 IMPLANT
SUTURE STRATFX 0 PDS+ CT-2 23 (SUTURE) ×1 IMPLANT
SYR 30ML LL (SYRINGE) ×1 IMPLANT
SYSTEM WECK SHIELD CLOSURE (TROCAR) IMPLANT
TRAP FLUID SMOKE EVACUATOR (MISCELLANEOUS) ×1 IMPLANT
TRAY FOLEY MTR SLVR 16FR STAT (SET/KITS/TRAYS/PACK) ×1 IMPLANT
WATER STERILE IRR 500ML POUR (IV SOLUTION) ×1 IMPLANT

## 2023-05-16 NOTE — Progress Notes (Signed)
   05/16/23 0715  Spiritual Encounters  Type of Visit Initial  Care provided to: Pt and family  Referral source Chaplain assessment  Reason for visit Routine spiritual support  OnCall Visit No  Interventions  Spiritual Care Interventions Made Established relationship of care and support;Compassionate presence;Reflective listening  Intervention Outcomes  Outcomes Connection to spiritual care  Spiritual Care Plan  Spiritual Care Issues Still Outstanding No further spiritual care needs at this time (see row info)

## 2023-05-16 NOTE — Op Note (Signed)
Preoperative diagnosis: Umbilical, initial reducible hernia Postoperative diagnosis: same  Procedure: Robotic assisted laparoscopic umbilical hernia repair with mesh  Anesthesia: general  Surgeon: Sung Amabile  Wound Classification: Clean  Specimen: none  Complications: None  Estimated Blood Loss: 10ml  Indications:see HPI  Findings: Umbilical hernia defect measuring 2.5 x 2 cm 2. Tension free repair achieved with ProGrip mesh and suture 3. Adequate hemostasis  Description of procedure: The patient was brought to the operating room and general anesthesia was induced. A time-out was completed verifying correct patient, procedure, site, positioning, and implant(s) and/or special equipment prior to beginning this procedure. Antibiotics were administered prior to making the incision. SCDs placed. The anterior abdominal wall was prepped and draped in the standard sterile fashion.   Palmer's point chosen for entry.  Veress needle placed and abdomen insufflated to 15cm without any dramatic increase in pressure.  Needle removed and a 8 mm port placed lateral to the umbilicus on the left side via Optiview technique.  No bowel injury noted.   additional ports, 8mm and 12mm, along left lateral aspect placed.  Exparel used as tap block under direct visualization.   Xi robot then docked into place.  Umbilical hernia defect measuring 2.5 x 2 cm was noted.  Preperitoneal plane was entered by making a incision along the right lateral aspect of the peritoneum.  This flap was carried across the abdomen to the other side of the defect, reducing all hernia contents and preperitoneal lipoma within the hernia defect.  Small amounts of bleeding was controlled with cautery.  Once adequate exposure of the defect and adequate space was created to place the mesh, insufflation dropped to 8mm and transfacial suture with 0 stratafix used to primarily close defect under minimal tension.  Overlying skin was tacked with  suture to secure umbilical stalk to fascia.   ProGrip mesh cut to size with adequate overlap around the defect edges was placed within the abdominal cavity through 12mm port and secured to the abdominal wall centered over the defect. The peritoneal flap was then closed with a running 3-0 V lock.  Robot was undocked.  The 12mm cannula was removed and port site was closed using Efx Shield device and 0 vicryl suture, ensuring no bowels were injured during this process.  Abdomen then desufflated while camera within abdomen to ensure no signs of new bleed prior to removing camera and rest of ports completely.  All skin incisions closed with runninrg 4-0 Monocryl in a subcuticular fashion.  All wounds then dressed with Dermabond.  Patient was then successfully awakened and transferred to PACU in stable condition.  At the end of the procedure sponge and instrument counts were correct.

## 2023-05-16 NOTE — Anesthesia Preprocedure Evaluation (Signed)
Anesthesia Evaluation  Patient identified by MRN, date of birth, ID band Patient awake    Reviewed: Allergy & Precautions, H&P , NPO status , Patient's Chart, lab work & pertinent test results, reviewed documented beta blocker date and time   History of Anesthesia Complications Negative for: history of anesthetic complications  Airway Mallampati: II  TM Distance: >3 FB Neck ROM: full    Dental  (+) Dental Advidsory Given, Caps, Teeth Intact   Pulmonary neg shortness of breath, sleep apnea and Continuous Positive Airway Pressure Ventilation , neg COPD, neg recent URI, former smoker   Pulmonary exam normal breath sounds clear to auscultation       Cardiovascular Exercise Tolerance: Good hypertension, (-) angina + CAD  (-) Past MI and (-) Cardiac Stents Normal cardiovascular exam(-) dysrhythmias (-) Valvular Problems/Murmurs Rhythm:regular Rate:Normal     Neuro/Psych negative neurological ROS  negative psych ROS   GI/Hepatic negative GI ROS, Neg liver ROS,,,  Endo/Other  negative endocrine ROS    Renal/GU negative Renal ROS  negative genitourinary   Musculoskeletal   Abdominal   Peds  Hematology negative hematology ROS (+)   Anesthesia Other Findings Past Medical History: No date: Adenomatous colon polyp No date: Complex sleep apnea syndrome No date: Essential hypertension 12/2022: Hemorrhoids 06/2021: Incidental lung nodule, less than or equal to 3mm No date: Mixed hyperlipidemia No date: Peripheral neuropathy No date: Pre-diabetes No date: Primary osteoarthritis of left knee No date: Rheumatoid arthritis (HCC)   Reproductive/Obstetrics negative OB ROS                             Anesthesia Physical Anesthesia Plan  ASA: 2  Anesthesia Plan: General   Post-op Pain Management:    Induction: Intravenous  PONV Risk Score and Plan: 2 and Ondansetron, Dexamethasone, Treatment may  vary due to age or medical condition and Midazolam  Airway Management Planned: Oral ETT  Additional Equipment:   Intra-op Plan:   Post-operative Plan: Extubation in OR  Informed Consent: I have reviewed the patients History and Physical, chart, labs and discussed the procedure including the risks, benefits and alternatives for the proposed anesthesia with the patient or authorized representative who has indicated his/her understanding and acceptance.     Dental Advisory Given  Plan Discussed with: Anesthesiologist, CRNA and Surgeon  Anesthesia Plan Comments:         Anesthesia Quick Evaluation

## 2023-05-16 NOTE — Anesthesia Procedure Notes (Signed)
Procedure Name: Intubation Date/Time: 05/16/2023 7:35 AM  Performed by: Rich Brave, CRNAPre-anesthesia Checklist: Patient identified, Emergency Drugs available, Suction available, Patient being monitored and Timeout performed Patient Re-evaluated:Patient Re-evaluated prior to induction Oxygen Delivery Method: Circle system utilized Preoxygenation: Pre-oxygenation with 100% oxygen Induction Type: IV induction and Inhalational induction Ventilation: Mask ventilation without difficulty Laryngoscope Size: McGrath and 4 Grade View: Grade I Tube type: Oral Tube size: 7.0 mm Number of attempts: 1 Airway Equipment and Method: Stylet and Video-laryngoscopy Placement Confirmation: ETT inserted through vocal cords under direct vision, positive ETCO2 and breath sounds checked- equal and bilateral Secured at: 21 cm Tube secured with: Tape Dental Injury: Teeth and Oropharynx as per pre-operative assessment

## 2023-05-16 NOTE — Transfer of Care (Signed)
Immediate Anesthesia Transfer of Care Note  Patient: Francis Robinson  Procedure(s) Performed: XI ROBOT ASSISTED UMBILICAL HERNIA REPAIR w/ mesh (Abdomen)  Patient Location: PACU  Anesthesia Type:General  Level of Consciousness: awake, alert , oriented, and patient cooperative  Airway & Oxygen Therapy: Patient Spontanous Breathing and Patient connected to face mask oxygen  Post-op Assessment: Report given to RN, Post -op Vital signs reviewed and stable, and Patient moving all extremities X 4  Post vital signs: Reviewed and stable  Last Vitals:  Vitals Value Taken Time  BP 139/80 05/16/23 0957  Temp 97.1   Pulse 59 05/16/23 1000  Resp 13 05/16/23 1000  SpO2 98 % 05/16/23 1000  Vitals shown include unfiled device data.  Last Pain:  Vitals:   05/16/23 0622  TempSrc: Oral  PainSc: 0-No pain         Complications: No notable events documented.

## 2023-05-16 NOTE — Interval H&P Note (Signed)
No change. OK to proceed.

## 2023-05-16 NOTE — Discharge Instructions (Signed)
Hernia repair, Care After ?This sheet gives you information about how to care for yourself after your procedure. Your health care provider may also give you more specific instructions. If you have problems or questions, contact your health care provider. ?What can I expect after the procedure? ?After your procedure, it is common to have the following: ?Pain in your abdomen, especially in the incision areas. You will be given medicine to control the pain. ?Tiredness. This is a normal part of the recovery process. Your energy level will return to normal over the next several weeks. ?Changes in your bowel movements, such as constipation or needing to go more often. Talk with your health care provider about how to manage this. ?Follow these instructions at home: ?Medicines ? tylenol and advil as needed for discomfort.  Please alternate between the two every four hours as needed for pain.   ? Use narcotics, if prescribed, only when tylenol and motrin is not enough to control pain. ? 325-650mg  every 8hrs to max of 3000mg /24hrs (including the 325mg  in every norco dose) for the tylenol.   ? Advil up to 800mg  per dose every 8hrs as needed for pain.   ?PLEASE RECORD NUMBER OF PILLS TAKEN UNTIL NEXT FOLLOW UP APPT.  THIS WILL HELP DETERMINE HOW READY YOU ARE TO BE RELEASED FROM ANY ACTIVITY RESTRICTIONS ?Do not drive or use heavy machinery while taking prescription pain medicine. ?Do not drink alcohol while taking prescription pain medicine. ? ?Incision care ? ?  ?Follow instructions from your health care provider about how to take care of your incision areas. Make sure you: ?Keep your incisions clean and dry. ?Wash your hands with soap and water before and after applying medicine to the areas, and before and after changing your bandage (dressing). If soap and water are not available, use hand sanitizer. ?Change your dressing as told by your health care provider. ?Leave stitches (sutures), skin glue, or adhesive strips in  place. These skin closures may need to stay in place for 2 weeks or longer. If adhesive strip edges start to loosen and curl up, you may trim the loose edges. Do not remove adhesive strips completely unless your health care provider tells you to do that. ?Do not wear tight clothing over the incisions. Tight clothing may rub and irritate the incision areas, which may cause the incisions to open. ?Do not take baths, swim, or use a hot tub until your health care provider approves. OK TO SHOWER IN 24HRS.   ?Check your incision area every day for signs of infection. Check for: ?More redness, swelling, or pain. ?More fluid or blood. ?Warmth. ?Pus or a bad smell. ?Activity ?Avoid lifting anything that is heavier than 10 lb (4.5 kg) for 2 weeks or until your health care provider says it is okay. ?No pushing/pulling greater than 30lbs ?You may resume normal activities as told by your health care provider. Ask your health care provider what activities are safe for you. ?Take rest breaks during the day as needed. ?Eating and drinking ?Follow instructions from your health care provider about what you can eat after surgery. ?To prevent or treat constipation while you are taking prescription pain medicine, your health care provider may recommend that you: ?Drink enough fluid to keep your urine clear or pale yellow. ?Take over-the-counter or prescription medicines. ?Eat foods that are high in fiber, such as fresh fruits and vegetables, whole grains, and beans. ?Limit foods that are high in fat and processed sugars, such as fried and  sweet foods. ?General instructions ?Ask your health care provider when you will need an appointment to get your sutures or staples removed. ?Keep all follow-up visits as told by your health care provider. This is important. ?Contact a health care provider if: ?You have more redness, swelling, or pain around your incisions. ?You have more fluid or blood coming from the incisions. ?Your incisions feel  warm to the touch. ?You have pus or a bad smell coming from your incisions or your dressing. ?You have a fever. ?You have an incision that breaks open (edges not staying together) after sutures or staples have been removed. ?You develop a rash. ?You have chest pain or difficulty breathing. ?You have pain or swelling in your legs. ?You feel light-headed or you faint. ?Your abdomen swells (becomes distended). ?You have nausea or vomiting. ?You have blood in your stool (feces). ?This information is not intended to replace advice given to you by your health care provider. Make sure you discuss any questions you have with your health care provider. ?Document Released: 10/26/2004 Document Revised: 12/26/2017 Document Reviewed: 01/08/2016 ?Elsevier Interactive Patient Education ? 2019 [employer] ?  ? ?

## 2023-05-23 NOTE — Anesthesia Postprocedure Evaluation (Signed)
Anesthesia Post Note  Patient: Francis Robinson  Procedure(s) Performed: XI ROBOT ASSISTED UMBILICAL HERNIA REPAIR w/ mesh (Abdomen)  Patient location during evaluation: PACU Anesthesia Type: General Level of consciousness: awake and alert Pain management: pain level controlled Vital Signs Assessment: post-procedure vital signs reviewed and stable Respiratory status: spontaneous breathing, nonlabored ventilation, respiratory function stable and patient connected to nasal cannula oxygen Cardiovascular status: blood pressure returned to baseline and stable Postop Assessment: no apparent nausea or vomiting Anesthetic complications: no   No notable events documented.   Last Vitals:  Vitals:   05/16/23 1100 05/16/23 1111  BP: 128/81 (!) 140/88  Pulse: 63 65  Resp: 19 17  Temp:  37 C  SpO2: 98% 95%    Last Pain:  Vitals:   05/16/23 1111  TempSrc: Temporal  PainSc: 5                  Lenard Simmer

## 2023-07-09 ENCOUNTER — Encounter: Payer: Self-pay | Admitting: Surgery

## 2023-07-16 ENCOUNTER — Ambulatory Visit: Admitting: Anesthesiology

## 2023-07-16 ENCOUNTER — Encounter
Admission: RE | Disposition: A | Discharge status: Home / Self Care | Payer: Self-pay | Source: Home / Self Care | Attending: Surgery

## 2023-07-16 ENCOUNTER — Ambulatory Visit
Admission: RE | Admit: 2023-07-16 | Discharge: 2023-07-16 | Disposition: A | Discharge status: Home / Self Care | Payer: 59 | Attending: Surgery | Admitting: Surgery

## 2023-07-16 DIAGNOSIS — Z6833 Body mass index (BMI) 33.0-33.9, adult: Secondary | ICD-10-CM | POA: Diagnosis not present

## 2023-07-16 DIAGNOSIS — I2510 Atherosclerotic heart disease of native coronary artery without angina pectoris: Secondary | ICD-10-CM | POA: Diagnosis not present

## 2023-07-16 DIAGNOSIS — I10 Essential (primary) hypertension: Secondary | ICD-10-CM | POA: Diagnosis not present

## 2023-07-16 DIAGNOSIS — G4733 Obstructive sleep apnea (adult) (pediatric): Secondary | ICD-10-CM | POA: Diagnosis not present

## 2023-07-16 DIAGNOSIS — E669 Obesity, unspecified: Secondary | ICD-10-CM | POA: Diagnosis not present

## 2023-07-16 DIAGNOSIS — K5730 Diverticulosis of large intestine without perforation or abscess without bleeding: Secondary | ICD-10-CM | POA: Insufficient documentation

## 2023-07-16 DIAGNOSIS — M069 Rheumatoid arthritis, unspecified: Secondary | ICD-10-CM | POA: Insufficient documentation

## 2023-07-16 DIAGNOSIS — Z87891 Personal history of nicotine dependence: Secondary | ICD-10-CM | POA: Diagnosis not present

## 2023-07-16 DIAGNOSIS — M170 Bilateral primary osteoarthritis of knee: Secondary | ICD-10-CM | POA: Diagnosis not present

## 2023-07-16 DIAGNOSIS — Z1211 Encounter for screening for malignant neoplasm of colon: Secondary | ICD-10-CM | POA: Insufficient documentation

## 2023-07-16 DIAGNOSIS — K640 First degree hemorrhoids: Secondary | ICD-10-CM | POA: Insufficient documentation

## 2023-07-16 DIAGNOSIS — Z860101 Personal history of adenomatous and serrated colon polyps: Secondary | ICD-10-CM | POA: Diagnosis not present

## 2023-07-16 HISTORY — PX: COLONOSCOPY WITH PROPOFOL: SHX5780

## 2023-07-16 SURGERY — COLONOSCOPY WITH PROPOFOL
Anesthesia: General

## 2023-07-16 MED ORDER — STERILE WATER FOR IRRIGATION IR SOLN
Status: DC | PRN
  Administered 2023-07-16: 50 mL

## 2023-07-16 MED ORDER — PHENYLEPHRINE 80 MCG/ML (10ML) SYRINGE FOR IV PUSH (FOR BLOOD PRESSURE SUPPORT)
PREFILLED_SYRINGE | INTRAVENOUS | Status: DC | PRN
  Administered 2023-07-16: 160 ug via INTRAVENOUS

## 2023-07-16 MED ORDER — GLYCOPYRROLATE 0.2 MG/ML IJ SOLN
INTRAMUSCULAR | Status: DC | PRN
  Administered 2023-07-16: .2 mg via INTRAVENOUS

## 2023-07-16 MED ORDER — LIDOCAINE HCL (CARDIAC) PF 100 MG/5ML IV SOSY
PREFILLED_SYRINGE | INTRAVENOUS | Status: DC | PRN
  Administered 2023-07-16: 100 mg via INTRAVENOUS

## 2023-07-16 MED ORDER — PROPOFOL 10 MG/ML IV BOLUS
INTRAVENOUS | Status: DC | PRN
  Administered 2023-07-16: 60 mg via INTRAVENOUS
  Administered 2023-07-16: 40 mg via INTRAVENOUS

## 2023-07-16 MED ORDER — PROPOFOL 500 MG/50ML IV EMUL
INTRAVENOUS | Status: DC | PRN
  Administered 2023-07-16: 165 ug/kg/min via INTRAVENOUS

## 2023-07-16 MED ORDER — SODIUM CHLORIDE 0.9 % IV SOLN: INTRAVENOUS | Status: DC

## 2023-07-16 MED ORDER — PROPOFOL 1000 MG/100ML IV EMUL
INTRAVENOUS | Status: AC
  Filled 2023-07-16: qty 100

## 2023-07-16 NOTE — Anesthesia Procedure Notes (Signed)
 Procedure Name: General with mask airway Date/Time: 07/16/2023 7:54 AM  Performed by: Mohammed Kindle, CRNAPre-anesthesia Checklist: Patient identified, Emergency Drugs available, Suction available and Patient being monitored Patient Re-evaluated:Patient Re-evaluated prior to induction Oxygen Delivery Method: Simple face mask Induction Type: IV induction Placement Confirmation: positive ETCO2 and breath sounds checked- equal and bilateral Dental Injury: Teeth and Oropharynx as per pre-operative assessment

## 2023-07-16 NOTE — Interval H&P Note (Signed)
 History and Physical Interval Note:  07/16/2023 10:42 AM  Francis Robinson  has presented today for surgery, with the diagnosis of encounter for screening colonoscopy Z12.11.  The various methods of treatment have been discussed with the patient and family. After consideration of risks, benefits and other options for treatment, the patient has consented to  Procedure(s): COLONOSCOPY WITH PROPOFOL (N/A) as a surgical intervention.  The patient's history has been reviewed, patient examined, no change in status, stable for surgery.  I have reviewed the patient's chart and labs.  Questions were answered to the patient's satisfaction.     Phiona Ramnauth Tonna Boehringer

## 2023-07-16 NOTE — Anesthesia Postprocedure Evaluation (Signed)
 Anesthesia Post Note  Patient: Francis Robinson  Procedure(s) Performed: COLONOSCOPY WITH PROPOFOL  Patient location during evaluation: Endoscopy Anesthesia Type: General Level of consciousness: awake and alert Pain management: pain level controlled Vital Signs Assessment: post-procedure vital signs reviewed and stable Respiratory status: spontaneous breathing, nonlabored ventilation, respiratory function stable and patient connected to nasal cannula oxygen Cardiovascular status: blood pressure returned to baseline and stable Postop Assessment: no apparent nausea or vomiting Anesthetic complications: no   No notable events documented.   Last Vitals:  Vitals:   07/16/23 0813 07/16/23 0823  BP: 99/69 119/80  Pulse: 62 60  Resp:    Temp:    SpO2: 98% 98%    Last Pain:  Vitals:   07/16/23 0823  TempSrc:   PainSc: 0-No pain                 Louie Boston

## 2023-07-16 NOTE — Op Note (Signed)
 [facility] Patient Name: Francis Robinson Procedure Date: 07/16/2023 7:19 AM MRN: [id number] Account #: [id number] Date of Birth: [birth year] Admit Type: Outpatient Age: 63 Room: [facility] ENDO ROOM 4 Gender: Male Note Status: Finalized Instrument Name: Prentice Docker [id number] Procedure:             Colonoscopy Indications:           Screening for colorectal malignant neoplasm Providers:             Sung Amabile MD, MD Referring MD:          Leim Fabry MD, MD (Referring MD) Medicines:             Propofol per Anesthesia Complications:         No immediate complications. Procedure:             Pre-Anesthesia Assessment:                        - After reviewing the risks and benefits, the patient                         was deemed in satisfactory condition to undergo the                         procedure in an ambulatory setting.                        After obtaining informed consent, the colonoscope was                         passed under direct vision. Throughout the procedure,                         the patient's blood pressure, pulse, and oxygen                         saturations were monitored continuously. The                         Colonoscope was introduced through the anus and                         advanced to the the cecum, identified by the ileocecal                         valve. The colonoscopy was performed without                         difficulty. The patient tolerated the procedure well.                         The quality of the bowel preparation was good. Findings:      The perianal and digital rectal examinations were normal.      Many small-mouthed diverticula were found in the sigmoid colon.      Non-bleeding internal hemorrhoids were found during retroflexion. The       hemorrhoids were Grade I (internal hemorrhoids that do not prolapse). Impression:            - Diverticulosis in the sigmoid colon.                        -  Non-bleeding internal hemorrhoids.                        - No specimens collected. Recommendation:        - Written discharge instructions were provided to the                         patient.                        - Written discharge instructions were provided to the                         patient.                        - Discharge patient to home.                        - Resume previous diet.                        - Repeat colonoscopy in 10 years for screening                         purposes. Procedure Code(s):     --- Professional ---                        G9562, Colorectal cancer screening; colonoscopy on                         individual not meeting criteria for high risk Diagnosis Code(s):     --- Professional ---                        Z12.11, Encounter for screening for malignant neoplasm                         of colon                        K64.0, First degree hemorrhoids                        K57.30, Diverticulosis of large intestine without                         perforation or abscess without bleeding CPT copyright 2022 American [employer]. All rights reserved. The codes documented in this report are preliminary and upon coder review may  be revised to meet current compliance requirements. Dr. Harrie Foreman, MD Sung Amabile MD, MD 07/16/2023 8:04:02 AM This report has been signed electronically. Number of Addenda: 0 Note Initiated On: 07/16/2023 7:19 AM Scope Withdrawal Time: 0 hours 7 minutes 54 seconds  Total Procedure Duration: 0 hours 14 minutes 52 seconds  Estimated Blood Loss:  Estimated blood loss: none.      [facility]

## 2023-07-16 NOTE — H&P (Signed)
 Subjective:    CC: Umbilical hernia without obstruction and without gangrene [K42.9]   HPI:   Subjective Francis Robinson is a 64 y.o. male who returns for evaluation of above. Asymptomatic but growing in size, will like repair.   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/2020), Bilateral high frequency sensorineural hearing loss (10/05/2018), Elbow injury, left, sequela (06/14/2014), Essential hypertension (11/25/2014), Heavy alcohol consumption (02/21/2015), Obesity (BMI 30.0-34.9) (03/07/2021), Obstructive sleep apnea syndrome (12/26/2021), Peripheral neuropathy (01/27/2013), Plantar fasciitis- left (01/27/2013), Prediabetes (03/07/2021), Primary osteoarthritis of both knees (05/01/2016), Pure hypercholesterolemia (01/27/2013), Rheumatoid arthritis(714.0) (CMS/HHS-HCC), Right knee pain, chronic (01/27/2013), S/P shoulder replacement, 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   Past Surgical History:  Past Surgical History       Past Surgical History:  Procedure Laterality Date   COLONOSCOPY N/A 05/05/2015    (03/27/2012) Dr. Christean Leaf @ [facility] - Adenomatous Polyps i.e. Sess Serr Adenoma(56mm)   ARTHROPLASTY TOTAL KNEE Right 07/01/2016    Procedure: ARTHROPLASTY, KNEE, CONDYLE AND PLATEAU; MEDIAL AND LATERAL COMPARTMENTSWITH OR WITHOUT PATELLA RESURFACING (TOTAL KNEE ARTHROPLASTY);  Surgeon: Currie Paris, MD;  Location: [facility] OR;  Service: Orthopedics;  Laterality: Right;   COLONOSCOPY W/BIOPSY N/A 05/08/2018    Procedure: COLONOSCOPY;  Surgeon: Dianne Dun, MD;  Location: [facility];  Service: Gastroenterology;  Laterality: N/A;   COLONOSCOPY N/A 05/08/2018    Dr. Clide Deutscher @ [facility] - Hyperplastic Polyps, PHP, FHP, 5 yr rpt per provider   REVISION TOTAL SHOULDER ARTHROPLASTY Left 11/01/2020    Procedure: Left ARTHROPLASTY, GLENOHUMERAL  JOINT; TOTAL SHOULDER (GLENOID AND PROXIMAL HUMERAL REPLACEMENT (EG, TOTAL SHOULDER);  Surgeon: Kennith Maes, MD;  Location: [facility];  Service: Orthopedics;  Laterality: Left;   TENODESIS BICEPS W/TRANSPLANTATION LONG TENDON OPEN Left 11/01/2020    Procedure: TENODESIS OF LONG TENDON OF BICEPS;  Surgeon: Kennith Maes, MD;  Location: [facility] OR;  Service: Orthopedics;  Laterality: Left;   ARTHROPLASTY TOTAL SHOULDER Right 05/31/2021    Procedure: RIGHT ARTHROPLASTY, GLENOHUMERAL JOINT; TOTAL SHOULDER (GLENOID AND PROXIMAL HUMERAL REPLACEMENT (EG, TOTAL SHOULDER);  Surgeon: Kennith Maes, MD;  Location: [facility];  Service: Orthopedics;  Laterality: Right;   TENODESIS BICEPS W/TRANSPLANTATION LONG TENDON OPEN Right 05/31/2021    Procedure: RIGHT TENODESIS OF LONG TENDON OF BICEPS;  Surgeon: Kennith Maes, MD;  Location: [facility];  Service: Orthopedics;  Laterality: Right;   ARTHROPLASTY TOTAL KNEE Left 01/15/2022    Procedure: LEFT ARTHROPLASTY, KNEE, CONDYLE AND PLATEAU; MEDIAL AND LATERAL COMPARTMENTSWITH OR WITHOUT PATELLA RESURFACING (TOTAL KNEE ARTHROPLASTY);  Surgeon: Currie Paris, MD;  Location: [facility];  Service: Orthopedics;  Laterality: Left;   hemorrhoidectomy/EUA   01/09/2023    Dr. Sung Amabile   cyst in left hand       cyst removal right back       FRACTURE SURGERY       JOINT REPLACEMENT   07/01/2016    Right knee   KNEE ARTHROSCOPY        right knee   left forearm repair 1982ish, from trauma       right hand /finger surgery       right heel-trauma as a child       TONSILLECTOMY       VASECTOMY       VASECTOMY            Family  History: family history includes Brain hemorrhage in his mother; Other in his father.   Social History:  reports that he quit smoking about 30 years ago. His smoking use included cigarettes. He has quit using smokeless tobacco.  His smokeless tobacco use included snuff. He reports  current alcohol use of about 4.0 standard drinks of alcohol per week. He reports that he does not currently use drugs after having used the following drugs: Marijuana.   Current Medications: has a current medication list which includes the following prescription(s): carvedilol, ezetimibe, lisinopril-hydrochlorothiazide, spironolactone, and triamcinolone.   Allergies:       Allergies as of 04/29/2023 - Reviewed 04/29/2023  Allergen Reaction Noted   Amlodipine Muscle Pain 01/20/2015   Methotrexate Rash 01/27/2012   Neurontin [gabapentin] Other (See Comments) 07/03/2016      ROS:  A 15 point review of systems was performed and pertinent positives and negatives noted in HPI   Objective:      Objective BP 139/76   Pulse 62   Ht 177.8 cm (5\' 10" )   Wt (!) 104.8 kg (231 lb 0.7 oz)   BMI 33.15 kg/m    Constitutional :  Alert, cooperative, no distress  Lymphatics/Throat:  Supple, no lymphadenopathy  Respiratory:  clear to auscultation bilaterally  Cardiovascular:  regular rate and rhythm  Gastrointestinal: soft, non-tender; bowel sounds normal; no masses,  no organomegaly. umbilical hernia noted.  moderate, reducible, and no overlying skin changes  Musculoskeletal: Steady gait and movement  Skin: Cool and moist  Psychiatric: Normal affect, non-agitated, not confused         LABS:  N/a    RADS: N/a Assessment:      Assessment Umbilical hernia without obstruction and without gangrene [K42.9]   Plan:      Plan 1. Umbilical hernia without obstruction and without gangrene [K42.9]   Discussed the risk of surgery including recurrence, which can be up to 50% in the case of incisional or complex hernias, possible use of prosthetic materials (mesh) and the increased risk of mesh infxn if used, bleeding, chronic pain, post-op infxn, post-op SBO or ileus, and possible re-operation to address said risks. The risks of general anesthetic, if used, includes MI, CVA, sudden death or even  reaction to anesthetic medications also discussed. Alternatives include continued observation.  Benefits include possible symptom relief, prevention of incarceration, strangulation, enlargement in size over time, and the risk of emergency surgery in the face of strangulation.    Typical post-op recovery time of 3-5 days with 2 weeks of activity restrictions were also discussed.   ED return precautions given for sudden increase in pain, size of hernia with accompanying fever, nausea, and/or vomiting.   The patient verbalized understanding and all questions were answered to the patient's satisfaction.   Patient has elected to proceed with surgical treatment. Procedure will be scheduled. robotic assisted laparoscopic. Will proceed after screening colonoscopy   2. Screening cscope. R/b/a discussed for colonoscopy  Risks include bleeding, perforation.  Benefits include diagnostic, curative procedure if needed.  Alternatives include continued observation.  Pt verbalized understanding.     labs/images/medications/previous chart entries reviewed personally and relevant changes/updates noted above.

## 2023-07-16 NOTE — Transfer of Care (Signed)
 Immediate Anesthesia Transfer of Care Note  Patient: Francis Robinson  Procedure(s) Performed: COLONOSCOPY WITH PROPOFOL  Patient Location: Endoscopy Unit  Anesthesia Type:General  Level of Consciousness: drowsy and patient cooperative  Airway & Oxygen Therapy: Patient Spontanous Breathing and Patient connected to face mask oxygen  Post-op Assessment: Report given to RN and Post -op Vital signs reviewed and stable  Post vital signs: Reviewed and stable  Last Vitals:  Vitals Value Taken Time  BP 88/51 07/16/23 0803  Temp 36.1 C 07/16/23 0803  Pulse 61 07/16/23 0803  Resp 19 07/16/23 0803  SpO2 99 % 07/16/23 0803    Last Pain:  Vitals:   07/16/23 0803  TempSrc: Temporal  PainSc: Asleep         Complications: No notable events documented.
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
## 2023-07-17 ENCOUNTER — Encounter: Payer: Self-pay | Admitting: Surgery
